# Patient Record
Sex: Male | Born: 1955 | Race: White | Hispanic: Yes | Marital: Married | Smoking: Current every day smoker
Health system: Southern US, Community
[De-identification: ages and names within clinical notes are randomized; demographics above are authoritative.]

## PROBLEM LIST (undated history)

## (undated) DIAGNOSIS — I10 Essential (primary) hypertension: Secondary | ICD-10-CM

## (undated) DIAGNOSIS — Z720 Tobacco use: Secondary | ICD-10-CM

## (undated) DIAGNOSIS — E785 Hyperlipidemia, unspecified: Secondary | ICD-10-CM

## (undated) HISTORY — PX: EYE SURGERY: SHX253

## (undated) HISTORY — PX: COLONOSCOPY: SHX174

---

## 2018-08-10 ENCOUNTER — Other Ambulatory Visit: Payer: Self-pay | Admitting: Internal Medicine

## 2018-08-10 DIAGNOSIS — Z20822 Contact with and (suspected) exposure to covid-19: Secondary | ICD-10-CM

## 2018-08-14 LAB — NOVEL CORONAVIRUS, NAA: SARS-CoV-2, NAA: NOT DETECTED

## 2018-08-18 ENCOUNTER — Telehealth: Payer: Self-pay

## 2018-08-18 NOTE — Telephone Encounter (Signed)
Pt's wife was notified of result. Nothing further is needed

## 2019-04-15 ENCOUNTER — Ambulatory Visit: Payer: Self-pay | Attending: Internal Medicine

## 2019-04-15 DIAGNOSIS — Z23 Encounter for immunization: Secondary | ICD-10-CM

## 2019-04-15 NOTE — Progress Notes (Signed)
   Covid-19 Vaccination Clinic  Name:  William Osborne    MRN: [id number] DOB: 1955-10-24  04/15/2019  [name] was observed post Covid-19 immunization for 15 minutes without incident. He was provided with Vaccine Information Sheet and instruction to access the V-Safe system.   [name] was instructed to call 911 with any severe reactions post vaccine: Marland Kitchen Difficulty breathing  . Swelling of face and throat  . A fast heartbeat  . A bad rash all over body  . Dizziness and weakness   Immunizations Administered    Name Date Dose VIS Date Route   Pfizer COVID-19 Vaccine 04/15/2019  2:29 PM 0.3 mL 12/29/2018 Intramuscular   Manufacturer: [employer], [employer]   Lot: [id number]   NDC: [address]-5

## 2019-05-06 ENCOUNTER — Ambulatory Visit: Payer: Self-pay | Attending: Internal Medicine

## 2019-05-06 DIAGNOSIS — Z23 Encounter for immunization: Secondary | ICD-10-CM

## 2019-05-06 NOTE — Progress Notes (Signed)
   Covid-19 Vaccination Clinic  Name:  Labarron Durnin    MRN: [id number] DOB: December 21, 1955  05/06/2019  Mr. Heyer was observed post Covid-19 immunization for 15 minutes without incident. He was provided with Vaccine Information Sheet and instruction to access the V-Safe system.   Mr. Barton was instructed to call 911 with any severe reactions post vaccine: Marland Kitchen Difficulty breathing  . Swelling of face and throat  . A fast heartbeat  . A bad rash all over body  . Dizziness and weakness   Immunizations Administered    Name Date Dose VIS Date Route   Pfizer COVID-19 Vaccine 05/06/2019  1:22 PM 0.3 mL 12/29/2018 Intramuscular   Manufacturer: [employer], [employer]   Lot: [id number]   NDC: [address]-3

## 2019-09-09 ENCOUNTER — Emergency Department: Payer: Commercial Managed Care - PPO

## 2019-09-09 ENCOUNTER — Emergency Department
Admission: EM | Admit: 2019-09-09 | Discharge: 2019-09-09 | Disposition: A | Discharge status: Home / Self Care | Payer: Commercial Managed Care - PPO | Attending: Emergency Medicine | Admitting: Emergency Medicine

## 2019-09-09 ENCOUNTER — Encounter: Payer: Self-pay | Admitting: Emergency Medicine

## 2019-09-09 ENCOUNTER — Other Ambulatory Visit: Payer: Self-pay

## 2019-09-09 DIAGNOSIS — K4090 Unilateral inguinal hernia, without obstruction or gangrene, not specified as recurrent: Secondary | ICD-10-CM | POA: Insufficient documentation

## 2019-09-09 LAB — CBC WITH DIFFERENTIAL/PLATELET
Abs Immature Granulocytes: 0.03 10*3/uL (ref 0.00–0.07)
Basophils Absolute: 0.1 10*3/uL (ref 0.0–0.1)
Basophils Relative: 1 %
Eosinophils Absolute: 0.3 10*3/uL (ref 0.0–0.5)
Eosinophils Relative: 3 %
HCT: 42.5 % (ref 39.0–52.0)
Hemoglobin: 14.3 g/dL (ref 13.0–17.0)
Immature Granulocytes: 0 %
Lymphocytes Relative: 21 %
Lymphs Abs: 1.6 10*3/uL (ref 0.7–4.0)
MCH: 29 pg (ref 26.0–34.0)
MCHC: 33.6 g/dL (ref 30.0–36.0)
MCV: 86.2 fL (ref 80.0–100.0)
Monocytes Absolute: 0.9 10*3/uL (ref 0.1–1.0)
Monocytes Relative: 12 %
Neutro Abs: 4.9 10*3/uL (ref 1.7–7.7)
Neutrophils Relative %: 63 %
Platelets: 352 10*3/uL (ref 150–400)
RBC: 4.93 MIL/uL (ref 4.22–5.81)
RDW: 13.8 % (ref 11.5–15.5)
WBC: 7.8 10*3/uL (ref 4.0–10.5)
nRBC: 0 % (ref 0.0–0.2)

## 2019-09-09 LAB — COMPREHENSIVE METABOLIC PANEL
ALT: 20 U/L (ref 0–44)
AST: 26 U/L (ref 15–41)
Albumin: 4.3 g/dL (ref 3.5–5.0)
Alkaline Phosphatase: 74 U/L (ref 38–126)
Anion gap: 12 (ref 5–15)
BUN: 14 mg/dL (ref 8–23)
CO2: 25 mmol/L (ref 22–32)
Calcium: 9.8 mg/dL (ref 8.9–10.3)
Chloride: 97 mmol/L — ABNORMAL LOW (ref 98–111)
Creatinine, Ser: 0.95 mg/dL (ref 0.61–1.24)
GFR calc Af Amer: >60 mL/min (ref >60)
GFR calc non Af Amer: >60 mL/min (ref >60)
Glucose, Bld: 125 mg/dL — ABNORMAL HIGH (ref 70–99)
Potassium: 4.3 mmol/L (ref 3.5–5.1)
Sodium: 134 mmol/L — ABNORMAL LOW (ref 135–145)
Total Bilirubin: 0.8 mg/dL (ref 0.3–1.2)
Total Protein: 8.9 g/dL — ABNORMAL HIGH (ref 6.5–8.1)

## 2019-09-09 LAB — LACTIC ACID, PLASMA: Lactic Acid, Venous: 0.9 mmol/L (ref 0.5–1.9)

## 2019-09-09 MED ORDER — IOHEXOL 9 MG/ML PO SOLN
500.0000 mL | Freq: Two times a day (BID) | ORAL | Status: DC | PRN
  Administered 2019-09-09: 500 mL via ORAL

## 2019-09-09 MED ORDER — IOHEXOL 300 MG/ML  SOLN
100.0000 mL | Freq: Once | INTRAMUSCULAR | Status: AC | PRN
  Administered 2019-09-09: 100 mL via INTRAVENOUS

## 2019-09-09 NOTE — ED Provider Notes (Signed)
[facility] Emergency Department Provider Note   ____________________________________________    I have reviewed the triage vital signs and the nursing notes.   HISTORY  Chief Complaint Inguinal Hernia     HPI William Osborne is a 64 y.o. male who presents with complaints of right groin discomfort.  Patient reports for approximately 1 month he has had intermittent discomfort in his right groin, occasionally has gotten worse currently it is feeling improved.  He does do physical labor.  No fevers or chills.  No nausea or vomiting.  No dysuria.  Has not take anything for this.  He did go to urgent care, apparently was referred to the emergency department for unclear reasons.  History reviewed. No pertinent past medical history.  There are no problems to display for this patient.   History reviewed. No pertinent surgical history.  Prior to Admission medications   Not on File     Allergies Patient has no known allergies.  No family history on file.  Social History Social History   Tobacco Use  . Smoking status: Never Smoker  . Smokeless tobacco: Never Used  Substance Use Topics  . Alcohol use: Yes  . Drug use: Never    Review of Systems  Constitutional: No fever/chills Eyes: No visual changes.  ENT: No sore throat. Cardiovascular: Denies chest pain. Respiratory: Denies shortness of breath. Gastrointestinal: As above  Genitourinary: Negative for dysuria.  No hematuria Musculoskeletal: Negative for back pain. Skin: Negative for rash. Neurological: Negative for headaches    ____________________________________________   PHYSICAL EXAM:  VITAL SIGNS: ED Triage Vitals  Enc Vitals Group     BP 09/09/19 0632 (!) 161/59     Pulse Rate 09/09/19 0632 77     Resp 09/09/19 0632 18     Temp 09/09/19 0632 99.1 F (37.3 C)     Temp Source 09/09/19 0632 Oral     SpO2 09/09/19 0632 97 %     Weight 09/09/19 0633 63.5 kg (140  lb)     Height 09/09/19 0633 1.549 m (5\' 1" )     Head Circumference --      Peak Flow --      Pain Score 09/09/19 0632 5     Pain Loc --      Pain Edu? --      Excl. in GC? --     Constitutional: Alert and oriented. No acute distress. Pleasant and interactive  Nose: No congestion/rhinnorhea. Mouth/Throat: Mucous membranes are moist.    Cardiovascular: Normal rate, regular rhythm. Grossly normal heart sounds.  Good peripheral circulation. Respiratory: Normal respiratory effort.  No retractions. Lungs CTAB. Gastrointestinal: Soft and nontender. No distention.  No CVA tenderness.  Genitourinary: Right inguinal hernia palpated, nontender, reducible Musculoskeletal: No lower extremity tenderness nor edema.  Warm and well perfused Neurologic:  Normal speech and language. No gross focal neurologic deficits are appreciated.  Skin:  Skin is warm, dry and intact. No rash noted. Psychiatric: Mood and affect are normal. Speech and behavior are normal.  ____________________________________________   LABS (all labs ordered are listed, but only abnormal results are displayed)  Labs Reviewed  COMPREHENSIVE METABOLIC PANEL - Abnormal; Notable for the following components:      Result Value   Sodium 134 (*)    Chloride 97 (*)    Glucose, Bld 125 (*)    Total Protein 8.9 (*)    All other components within normal limits  CBC WITH DIFFERENTIAL/PLATELET  LACTIC ACID,  PLASMA  LACTIC ACID, PLASMA   ____________________________________________  EKG  None ____________________________________________  RADIOLOGY  CT abdomen pelvis without acute abnormality ____________________________________________   PROCEDURES  Procedure(s) performed: No  Procedures   Critical Care performed: No ____________________________________________   INITIAL IMPRESSION / ASSESSMENT AND PLAN / ED COURSE  Pertinent labs & imaging results that were available during my care of the patient were reviewed  by me and considered in my medical decision making (see chart for details).  Patient presents with right inguinal discomfort suspicious for inguinal hernia.  Small inguinal hernia noted on exam, reducible, nontender.  Lab work is quite reassuring with normal white blood cell count.  CT scan had been obtained in triage due to long wait times which does not demonstrate any acute abnormalities  Refer to general surgery for further evaluation and treatment    ____________________________________________   FINAL CLINICAL IMPRESSION(S) / ED DIAGNOSES  Final diagnoses:  Inguinal hernia of right side without obstruction or gangrene        Note:  This document was prepared using Dragon voice recognition software and may include unintentional dictation errors.   Jene Every, MD 09/09/19 [phone]

## 2019-09-09 NOTE — ED Triage Notes (Signed)
Patient states that he has had a right inguinal hernia times one month. Patient states that he had an increase in swelling and went to [facility]. Patient states that [facility] called him and told him to come to the ED.

## 2019-09-09 NOTE — ED Notes (Signed)
Pt wandered over to B side and let this RN know that he was finished with oral contrast. This RN informed CT.

## 2019-09-13 ENCOUNTER — Ambulatory Visit: Payer: PRIVATE HEALTH INSURANCE | Admitting: Surgery

## 2019-09-20 ENCOUNTER — Ambulatory Visit: Payer: PRIVATE HEALTH INSURANCE | Admitting: Surgery

## 2019-11-01 ENCOUNTER — Encounter: Payer: Self-pay | Admitting: *Deleted

## 2019-11-13 ENCOUNTER — Other Ambulatory Visit: Payer: Self-pay

## 2019-11-13 ENCOUNTER — Ambulatory Visit (INDEPENDENT_AMBULATORY_CARE_PROVIDER_SITE_OTHER): Payer: PRIVATE HEALTH INSURANCE | Admitting: Surgery

## 2019-11-13 ENCOUNTER — Encounter: Payer: Self-pay | Admitting: Surgery

## 2019-11-13 ENCOUNTER — Telehealth: Payer: Self-pay | Admitting: Surgery

## 2019-11-13 ENCOUNTER — Ambulatory Visit: Payer: Self-pay | Admitting: Surgery

## 2019-11-13 VITALS — BP 171/85 | HR 78 | Temp 99.0°F | Resp 12 | Ht 61.0 in | Wt 136.4 lb

## 2019-11-13 DIAGNOSIS — K4090 Unilateral inguinal hernia, without obstruction or gangrene, not specified as recurrent: Secondary | ICD-10-CM

## 2019-11-13 NOTE — Patient Instructions (Signed)
Our surgery scheduler will contact you to schedule your surgery. Have the blue sheet available when she calls you.  Call the office if you have any questions or concerns.   Inguinal Hernia, Adult An inguinal hernia develops when fat or the intestines push through a weak spot in a muscle where your leg meets your lower abdomen (groin). This creates a bulge. This kind of hernia could also be:  In your scrotum, if you are male.  In folds of skin around your vagina, if you are male. There are three types of inguinal hernias:  Hernias that can be pushed back into the abdomen (are reducible). This type rarely causes pain.  Hernias that are not reducible (are incarcerated).  Hernias that are not reducible and lose their blood supply (are strangulated). This type of hernia requires emergency surgery. What are the causes? This condition is caused by having a weak spot in the muscles or tissues in the groin. This weak spot develops over time. The hernia may poke through the weak spot when you suddenly strain your lower abdominal muscles, such as when you:  Lift a heavy object.  Strain to have a bowel movement. Constipation can lead to straining.  Cough. What increases the risk? This condition is more likely to develop in:  Men.  Pregnant women.  People who: ? Are overweight. ? Work in jobs that require long periods of standing or heavy lifting. ? Have had an inguinal hernia before. ? Smoke or have lung disease. These factors can lead to long-lasting (chronic) coughing. What are the signs or symptoms? Symptoms may depend on the size of the hernia. Often, a small inguinal hernia has no symptoms. Symptoms of a larger hernia may include:  A lump in the groin area. This is easier to see when standing. It might not be visible when lying down.  Pain or burning in the groin. This may get worse when lifting, straining, or coughing.  A dull ache or a feeling of pressure in the groin.  In  men, an unusual lump in the scrotum. Symptoms of a strangulated inguinal hernia may include:  A bulge in your groin that is very painful and tender to the touch.  A bulge that turns red or purple.  Fever, nausea, and vomiting.  Inability to have a bowel movement or to pass gas. How is this diagnosed? This condition is diagnosed based on your symptoms, your medical history, and a physical exam. Your health care provider may feel your groin area and ask you to cough. How is this treated? Treatment depends on the size of your hernia and whether you have symptoms. If you do not have symptoms, your health care provider may have you watch your hernia carefully and have you come in for follow-up visits. If your hernia is large or if you have symptoms, you may need surgery to repair the hernia. Follow these instructions at home: Lifestyle  Avoid lifting heavy objects.  Avoid standing for long periods of time.  Do not use any products that contain nicotine or tobacco, such as cigarettes and e-cigarettes. If you need help quitting, ask your health care provider.  Maintain a healthy weight. Preventing constipation  Take actions to prevent constipation. Constipation leads to straining with bowel movements, which can make a hernia worse or cause a hernia repair to break down. Your health care provider may recommend that you: ? Drink enough fluid to keep your urine pale yellow. ? Eat foods that are high in  fiber, such as fresh fruits and vegetables, whole grains, and beans. ? Limit foods that are high in fat and processed sugars, such as fried or sweet foods. ? Take an over-the-counter or prescription medicine for constipation. General instructions  You may try to push the hernia back in place by very gently pressing on it while lying down. Do not try to force the bulge back in if it will not push in easily.  Watch your hernia for any changes in shape, size, or color. Get help right away if you  notice any changes.  Take over-the-counter and prescription medicines only as told by your health care provider.  Keep all follow-up visits as told by your health care provider. This is important. Contact a health care provider if:  You have a fever.  You develop new symptoms.  Your symptoms get worse. Get help right away if:  You have pain in your groin that suddenly gets worse.  You have a bulge in your groin that: ? Suddenly gets bigger and does not get smaller. ? Becomes red or purple or painful to the touch.  You are a man and you have a sudden pain in your scrotum, or the size of your scrotum suddenly changes.  You cannot push the hernia back in place by very gently pressing on it when you are lying down. Do not try to force the bulge back in if it will not push in easily.  You have nausea or vomiting that does not go away.  You have a fast heartbeat.  You cannot have a bowel movement or pass gas. These symptoms may represent a serious problem that is an emergency. Do not wait to see if the symptoms will go away. Get medical help right away. Call your local emergency services (911 in the [place] Summary  An inguinal hernia develops when fat or the intestines push through a weak spot in a muscle where your leg meets your lower abdomen (groin).  This condition is caused by having a weak spot in muscles or tissue in your groin.  Symptoms may depend on the size of the hernia, and they may include pain or swelling in your groin. A small inguinal hernia often has no symptoms.  Treatment may not be needed if you do not have symptoms. If you have symptoms or a large hernia, you may need surgery to repair the hernia.  Avoid lifting heavy objects. Also avoid standing for long amounts of time. This information is not intended to replace advice given to you by your health care provider. Make sure you discuss any questions you have with your health care provider. Document Revised:  02/05/2017 Document Reviewed: 10/06/2016 Elsevier Patient Education  2020 [employer].

## 2019-11-13 NOTE — H&P (View-Only) (Signed)
Patient ID: William Osborne, male   DOB: 22-Jun-1955, 64 y.o.   MRN: [id number]  Chief Complaint: Right inguinal hernia  History of Present Illness William Osborne is a 64 y.o. male with right groin pain and bulge noted back in August.  When it began it was painful to walk, even uncomfortable when laying flat.  The swelling and discomfort is diminished somewhat.  The bulge persists but does not stay out.  No prior history of hernia repair, no difficulty urinating.  Denies any history of fevers or chills.  Past Medical History History reviewed. No pertinent past medical history.    Past Surgical History:  Procedure Laterality Date  . EYE SURGERY      No Known Allergies  No current outpatient medications on file.   No current facility-administered medications for this visit.    Family History History reviewed. No pertinent family history.    Social History Social History   Tobacco Use  . Smoking status: Former Smoker    Types: Cigarettes  . Smokeless tobacco: Never Used  Substance Use Topics  . Alcohol use: Yes  . Drug use: Never        Review of Systems  Constitutional: Negative.   HENT: Positive for hearing loss and tinnitus.   Eyes: Negative.   Respiratory: Negative.   Cardiovascular: Negative.   Gastrointestinal: Negative.   Genitourinary: Negative.   Skin: Positive for itching.  Neurological: Negative.   Psychiatric/Behavioral: Negative.       Physical Exam Blood pressure (!) 171/85, pulse 78, temperature 99 F (37.2 C), resp. rate 12, height 5\' 1"  (1.549 m), weight 136 lb 6.4 oz (61.9 kg), SpO2 96 %. Last Weight  Most recent update: 11/13/2019  2:39 PM   Weight  61.9 kg (136 lb 6.4 oz)            CONSTITUTIONAL: Well developed, and nourished, appropriately responsive and aware without distress.   EYES: Sclera non-icteric.   EARS, NOSE, MOUTH AND THROAT: Mask worn.   Hearing is intact to voice.  NECK: Trachea is midline, and there  is no jugular venous distension.  LYMPH NODES:  Lymph nodes in the neck are not enlarged. RESPIRATORY:  Lungs are clear, and breath sounds are equal bilaterally. Normal respiratory effort without pathologic use of accessory muscles. CARDIOVASCULAR: Heart is regular in rate and rhythm. GI: The abdomen is soft, nontender, and nondistended. There were no palpable masses. I did not appreciate hepatosplenomegaly. There were normal bowel sounds. GU: Right groin bulge, consistent with well-established hernia.  Testes are descended bilaterally.  Possible hernia on the left side as well. MUSCULOSKELETAL:  Symmetrical muscle tone appreciated in all four extremities.    SKIN: Skin turgor is normal. No pathologic skin lesions appreciated.  NEUROLOGIC:  Motor and sensation appear grossly normal.  Cranial nerves are grossly without defect. PSYCH:  Alert and oriented to person, place and time. Affect is appropriate for situation.  Data Reviewed I have personally reviewed what is currently available of the patient's imaging, recent labs and medical records.   Labs:  CBC Latest Ref Rng & Units 09/09/2019  WBC 4.0 - 10.5 K/uL 7.8  Hemoglobin 13.0 - 17.0 g/dL 09/11/2019  Hematocrit 39 - 52 % 42.5  Platelets 150 - 400 K/uL 352   CMP Latest Ref Rng & Units 09/09/2019  Glucose 70 - 99 mg/dL 09/11/2019)  BUN 8 - 23 mg/dL 14  Creatinine 338(S - 5.05 mg/dL 3.97  Sodium 6.73 - 419 mmol/L 134(L)  Potassium 3.5 - 5.1 mmol/L 4.3  Chloride 98 - 111 mmol/L 97(L)  CO2 22 - 32 mmol/L 25  Calcium 8.9 - 10.3 mg/dL 9.8  Total Protein 6.5 - 8.1 g/dL 8.9(H)  Total Bilirubin 0.3 - 1.2 mg/dL 0.8  Alkaline Phos 38 - 126 U/L 74  AST 15 - 41 U/L 26  ALT 0 - 44 U/L 20      Imaging:  Within last 24 hrs: No results found.  Assessment    Right inguinal hernia, possible bilateral. There are no problems to display for this patient.   Plan    Robotic right inguinal hernia repair, possible bilateral.  I discussed possibility of  incarceration, strangulation, enlargement in size over time, and the risk of emergency surgery in the face of strangulation.  Also discussed the risk of surgery including recurrence, use of prosthetic materials (mesh) and the increased risk of infxn, post-op infxn and the possible need for re-operation and removal of mesh if used, possibility of post-op SBO or ileus, and the risks of general anesthetic including MI, CVA, sudden death or even reaction to anesthetic medications. The patient and family understands the risks, any and all questions were answered to the patient's satisfaction.   Face-to-face time spent with the patient and accompanying care providers(if present) was 30 minutes, with more than 50% of the time spent counseling, educating, and coordinating care of the patient.      Campbell Lerner M.D., FACS 11/13/2019, 3:04 PM

## 2019-11-13 NOTE — Progress Notes (Signed)
Patient ID: William Osborne, male   DOB: 22-Jun-1955, 64 y.o.   MRN: [id number]  Chief Complaint: Right inguinal hernia  History of Present Illness William Osborne is a 64 y.o. male with right groin pain and bulge noted back in August.  When it began it was painful to walk, even uncomfortable when laying flat.  The swelling and discomfort is diminished somewhat.  The bulge persists but does not stay out.  No prior history of hernia repair, no difficulty urinating.  Denies any history of fevers or chills.  Past Medical History History reviewed. No pertinent past medical history.    Past Surgical History:  Procedure Laterality Date  . EYE SURGERY      No Known Allergies  No current outpatient medications on file.   No current facility-administered medications for this visit.    Family History History reviewed. No pertinent family history.    Social History Social History   Tobacco Use  . Smoking status: Former Smoker    Types: Cigarettes  . Smokeless tobacco: Never Used  Substance Use Topics  . Alcohol use: Yes  . Drug use: Never        Review of Systems  Constitutional: Negative.   HENT: Positive for hearing loss and tinnitus.   Eyes: Negative.   Respiratory: Negative.   Cardiovascular: Negative.   Gastrointestinal: Negative.   Genitourinary: Negative.   Skin: Positive for itching.  Neurological: Negative.   Psychiatric/Behavioral: Negative.       Physical Exam Blood pressure (!) 171/85, pulse 78, temperature 99 F (37.2 C), resp. rate 12, height 5\' 1"  (1.549 m), weight 136 lb 6.4 oz (61.9 kg), SpO2 96 %. Last Weight  Most recent update: 11/13/2019  2:39 PM   Weight  61.9 kg (136 lb 6.4 oz)            CONSTITUTIONAL: Well developed, and nourished, appropriately responsive and aware without distress.   EYES: Sclera non-icteric.   EARS, NOSE, MOUTH AND THROAT: Mask worn.   Hearing is intact to voice.  NECK: Trachea is midline, and there  is no jugular venous distension.  LYMPH NODES:  Lymph nodes in the neck are not enlarged. RESPIRATORY:  Lungs are clear, and breath sounds are equal bilaterally. Normal respiratory effort without pathologic use of accessory muscles. CARDIOVASCULAR: Heart is regular in rate and rhythm. GI: The abdomen is soft, nontender, and nondistended. There were no palpable masses. I did not appreciate hepatosplenomegaly. There were normal bowel sounds. GU: Right groin bulge, consistent with well-established hernia.  Testes are descended bilaterally.  Possible hernia on the left side as well. MUSCULOSKELETAL:  Symmetrical muscle tone appreciated in all four extremities.    SKIN: Skin turgor is normal. No pathologic skin lesions appreciated.  NEUROLOGIC:  Motor and sensation appear grossly normal.  Cranial nerves are grossly without defect. PSYCH:  Alert and oriented to person, place and time. Affect is appropriate for situation.  Data Reviewed I have personally reviewed what is currently available of the patient's imaging, recent labs and medical records.   Labs:  CBC Latest Ref Rng & Units 09/09/2019  WBC 4.0 - 10.5 K/uL 7.8  Hemoglobin 13.0 - 17.0 g/dL 09/11/2019  Hematocrit 39 - 52 % 42.5  Platelets 150 - 400 K/uL 352   CMP Latest Ref Rng & Units 09/09/2019  Glucose 70 - 99 mg/dL 09/11/2019)  BUN 8 - 23 mg/dL 14  Creatinine 338(S - 5.05 mg/dL 3.97  Sodium 6.73 - 419 mmol/L 134(L)  Potassium 3.5 - 5.1 mmol/L 4.3  Chloride 98 - 111 mmol/L 97(L)  CO2 22 - 32 mmol/L 25  Calcium 8.9 - 10.3 mg/dL 9.8  Total Protein 6.5 - 8.1 g/dL 8.9(H)  Total Bilirubin 0.3 - 1.2 mg/dL 0.8  Alkaline Phos 38 - 126 U/L 74  AST 15 - 41 U/L 26  ALT 0 - 44 U/L 20      Imaging:  Within last 24 hrs: No results found.  Assessment    Right inguinal hernia, possible bilateral. There are no problems to display for this patient.   Plan    Robotic right inguinal hernia repair, possible bilateral.  I discussed possibility of  incarceration, strangulation, enlargement in size over time, and the risk of emergency surgery in the face of strangulation.  Also discussed the risk of surgery including recurrence, use of prosthetic materials (mesh) and the increased risk of infxn, post-op infxn and the possible need for re-operation and removal of mesh if used, possibility of post-op SBO or ileus, and the risks of general anesthetic including MI, CVA, sudden death or even reaction to anesthetic medications. The patient and family understands the risks, any and all questions were answered to the patient's satisfaction.   Face-to-face time spent with the patient and accompanying care providers(if present) was 30 minutes, with more than 50% of the time spent counseling, educating, and coordinating care of the patient.      Dhanya Bogle M.D., FACS 11/13/2019, 3:04 PM     

## 2019-11-13 NOTE — Telephone Encounter (Signed)
Spoke with wife, Carollee Herter, she is informed of Pre-Admission date/time, COVID Testing date and Surgery date.  Surgery Date: 11/26/19 Preadmission Testing Date: 11/19/19 (phone 8a-1p) Covid Testing Date: 11/22/19 - patient advised to [facility] to [facility] ([address] [facility]) between 8a-1p   Patient has been made aware to call [phone], between 1-3:00pm the day before surgery, to find out what time to arrive for surgery.

## 2019-11-19 ENCOUNTER — Other Ambulatory Visit: Payer: Self-pay

## 2019-11-19 ENCOUNTER — Encounter
Admission: RE | Admit: 2019-11-19 | Discharge: 2019-11-19 | Disposition: A | Discharge status: Home / Self Care | Payer: PRIVATE HEALTH INSURANCE | Source: Ambulatory Visit | Attending: Surgery | Admitting: Surgery

## 2019-11-19 DIAGNOSIS — Z01818 Encounter for other preprocedural examination: Secondary | ICD-10-CM | POA: Insufficient documentation

## 2019-11-19 NOTE — Patient Instructions (Signed)
Your procedure is scheduled on:11-26-19 MONDAY Report to Day Surgery on the 2nd floor of the [facility]. To find out your arrival time, please call [phone] between 1PM - 3PM on:11-23-19 FRIDAY  REMEMBER: Instructions that are not followed completely may result in serious medical risk, up to and including death; or upon the discretion of your surgeon and anesthesiologist your surgery may need to be rescheduled.  Do not eat food after midnight the night before surgery.  No gum chewing, lozengers or hard candies.  You may however, drink CLEAR liquids up to 2 hours before you are scheduled to arrive for your surgery. Do not drink anything within 2 hours of your scheduled arrival time.  Clear liquids include: - water  - apple juice without pulp - gatorade (not RED, PURPLE, OR BLUE) - black coffee or tea (Do NOT add milk or creamers to the coffee or tea) Do NOT drink anything that is not on this list.  TAKE THESE MEDICATIONS THE MORNING OF SURGERY WITH A SIP OF WATER: -NONE  One week prior to surgery: Stop Anti-inflammatories (NSAIDS) such as Advil, Aleve, Ibuprofen, Motrin, Naproxen, Naprosyn and Aspirin based products such as Excedrin, Goodys Powder, BC Powder-OK TO TAKE TYLENOL IF NEEDED  Stop ANY OVER THE COUNTER supplements until after surgery.   No Alcohol for 24 hours before or after surgery.  No Smoking including e-cigarettes for 24 hours prior to surgery.  No chewable tobacco products for at least 6 hours prior to surgery.  No nicotine patches on the day of surgery.  Do not use any "recreational" drugs for at least a week prior to your surgery.  Please be advised that the combination of cocaine and anesthesia may have negative outcomes, up to and including death. If you test positive for cocaine, your surgery will be cancelled.  On the morning of surgery brush your teeth with toothpaste and water, you may rinse your mouth with mouthwash if you wish. Do not swallow any  toothpaste or mouthwash.  Do not wear jewelry, make-up, hairpins, clips or nail polish.  Do not wear lotions, powders, or perfumes.   Do not shave 48 hours prior to surgery.   Contact lenses, hearing aids and dentures may not be worn into surgery.  Do not bring valuables to the hospital. [facility] is not responsible for any missing/lost belongings or valuables.   Use CHG Soap as directed on instruction sheet.  Notify your doctor if there is any change in your medical condition (cold, fever, infection).  Wear comfortable clothing (specific to your surgery type) to the hospital.  Plan for stool softeners for home use; pain medications have a tendency to cause constipation. You can also help prevent constipation by eating foods high in fiber such as fruits and vegetables and drinking plenty of fluids as your diet allows.  After surgery, you can help prevent lung complications by doing breathing exercises.  Take deep breaths and cough every 1-2 hours. Your doctor may order a device called an Incentive Spirometer to help you take deep breaths. When coughing or sneezing, hold a pillow firmly against your incision with both hands. This is called "splinting." Doing this helps protect your incision. It also decreases belly discomfort.  If you are being admitted to the hospital overnight, leave your suitcase in the car. After surgery it may be brought to your room.  If you are being discharged the day of surgery, you will not be allowed to drive home. You will need a  responsible adult (18 years or older) to drive you home and stay with you that night.   If you are taking public transportation, you will need to have a responsible adult (18 years or older) with you. Please confirm with your physician that it is acceptable to use public transportation.   Please call the [facility] at [phone] if you have any questions about these instructions.  Visitation  Policy:  Patients undergoing a surgery or procedure may have one family member or support person with them as long as that person is not COVID-19 positive or experiencing its symptoms.  That person may remain in the waiting area during the procedure.  Inpatient Visitation Update:   In an effort to ensure the safety of our team members and our patients, we are implementing a change to our visitation policy:  Effective Monday, Aug. 9, at 7 a.m., inpatients will be allowed one support person.  o The support person may change daily.  o The support person must pass our screening, gel in and out, and wear a mask at all times, including in the patient's room.  o Patients must also wear a mask when staff or their support person are in the room.  o Masking is required regardless of vaccination status.  Systemwide, no visitors 17 or younger.

## 2019-11-22 ENCOUNTER — Other Ambulatory Visit
Admission: RE | Admit: 2019-11-22 | Discharge: 2019-11-22 | Disposition: A | Discharge status: Home / Self Care | Payer: Commercial Managed Care - PPO | Source: Ambulatory Visit | Attending: Surgery | Admitting: Surgery

## 2019-11-22 ENCOUNTER — Other Ambulatory Visit: Payer: Self-pay

## 2019-11-22 DIAGNOSIS — Z01812 Encounter for preprocedural laboratory examination: Secondary | ICD-10-CM | POA: Diagnosis not present

## 2019-11-22 DIAGNOSIS — Z20822 Contact with and (suspected) exposure to covid-19: Secondary | ICD-10-CM | POA: Diagnosis not present

## 2019-11-22 LAB — CBC WITH DIFFERENTIAL/PLATELET
Abs Immature Granulocytes: 0.02 10*3/uL (ref 0.00–0.07)
Basophils Absolute: 0.1 10*3/uL (ref 0.0–0.1)
Basophils Relative: 2 %
Eosinophils Absolute: 0.4 10*3/uL (ref 0.0–0.5)
Eosinophils Relative: 6 %
HCT: 40.2 % (ref 39.0–52.0)
Hemoglobin: 13.9 g/dL (ref 13.0–17.0)
Immature Granulocytes: 0 %
Lymphocytes Relative: 31 %
Lymphs Abs: 1.8 10*3/uL (ref 0.7–4.0)
MCH: 29 pg (ref 26.0–34.0)
MCHC: 34.6 g/dL (ref 30.0–36.0)
MCV: 83.9 fL (ref 80.0–100.0)
Monocytes Absolute: 0.7 10*3/uL (ref 0.1–1.0)
Monocytes Relative: 12 %
Neutro Abs: 2.9 10*3/uL (ref 1.7–7.7)
Neutrophils Relative %: 49 %
Platelets: 292 10*3/uL (ref 150–400)
RBC: 4.79 MIL/uL (ref 4.22–5.81)
RDW: 14.7 % (ref 11.5–15.5)
WBC: 5.9 10*3/uL (ref 4.0–10.5)
nRBC: 0 % (ref 0.0–0.2)

## 2019-11-22 LAB — COMPREHENSIVE METABOLIC PANEL
ALT: 25 U/L (ref 0–44)
AST: 27 U/L (ref 15–41)
Albumin: 4.3 g/dL (ref 3.5–5.0)
Alkaline Phosphatase: 53 U/L (ref 38–126)
Anion gap: 10 (ref 5–15)
BUN: 15 mg/dL (ref 8–23)
CO2: 25 mmol/L (ref 22–32)
Calcium: 9.9 mg/dL (ref 8.9–10.3)
Chloride: 99 mmol/L (ref 98–111)
Creatinine, Ser: 0.91 mg/dL (ref 0.61–1.24)
GFR, Estimated: >60 mL/min (ref >60)
Glucose, Bld: 100 mg/dL — ABNORMAL HIGH (ref 70–99)
Potassium: 4.1 mmol/L (ref 3.5–5.1)
Sodium: 134 mmol/L — ABNORMAL LOW (ref 135–145)
Total Bilirubin: 0.9 mg/dL (ref 0.3–1.2)
Total Protein: 8 g/dL (ref 6.5–8.1)

## 2019-11-22 LAB — SARS CORONAVIRUS 2 (TAT 6-24 HRS): SARS Coronavirus 2: NEGATIVE

## 2019-11-25 MED ORDER — LACTATED RINGERS IV SOLN: INTRAVENOUS | Status: DC

## 2019-11-25 MED ORDER — ACETAMINOPHEN 500 MG PO TABS: 1000.0000 mg | ORAL_TABLET | ORAL | Status: AC

## 2019-11-25 MED ORDER — BUPIVACAINE LIPOSOME 1.3 % IJ SUSP: 20.0000 mL | Freq: Once | INTRAMUSCULAR | Status: DC

## 2019-11-25 MED ORDER — ORAL CARE MOUTH RINSE: 15.0000 mL | Freq: Once | OROMUCOSAL | Status: AC

## 2019-11-25 MED ORDER — CELECOXIB 200 MG PO CAPS: 200.0000 mg | ORAL_CAPSULE | ORAL | Status: AC

## 2019-11-25 MED ORDER — CEFAZOLIN SODIUM-DEXTROSE 2-4 GM/100ML-% IV SOLN
2.0000 g | INTRAVENOUS | Status: AC
  Administered 2019-11-26: 2 g via INTRAVENOUS

## 2019-11-25 MED ORDER — GABAPENTIN 300 MG PO CAPS: 300.0000 mg | ORAL_CAPSULE | ORAL | Status: AC

## 2019-11-25 MED ORDER — FAMOTIDINE 20 MG PO TABS: 20.0000 mg | ORAL_TABLET | Freq: Once | ORAL | Status: AC

## 2019-11-25 MED ORDER — CHLORHEXIDINE GLUCONATE 0.12 % MT SOLN
15.0000 mL | Freq: Once | OROMUCOSAL | Status: AC
Start: 2019-11-25 — End: 2019-11-26

## 2019-11-26 ENCOUNTER — Ambulatory Visit
Admission: RE | Admit: 2019-11-26 | Discharge: 2019-11-26 | Disposition: A | Discharge status: Home / Self Care | Payer: Commercial Managed Care - PPO | Attending: Surgery | Admitting: Surgery

## 2019-11-26 ENCOUNTER — Ambulatory Visit: Payer: Commercial Managed Care - PPO | Admitting: Anesthesiology

## 2019-11-26 ENCOUNTER — Encounter: Payer: Self-pay | Admitting: Surgery

## 2019-11-26 ENCOUNTER — Other Ambulatory Visit: Payer: Self-pay

## 2019-11-26 ENCOUNTER — Encounter
Admission: RE | Disposition: A | Discharge status: Home / Self Care | Payer: Self-pay | Source: Home / Self Care | Attending: Surgery

## 2019-11-26 DIAGNOSIS — K4090 Unilateral inguinal hernia, without obstruction or gangrene, not specified as recurrent: Secondary | ICD-10-CM | POA: Diagnosis not present

## 2019-11-26 DIAGNOSIS — Z87891 Personal history of nicotine dependence: Secondary | ICD-10-CM | POA: Insufficient documentation

## 2019-11-26 HISTORY — PX: XI ROBOTIC ASSISTED INGUINAL HERNIA REPAIR WITH MESH: SHX6706

## 2019-11-26 SURGERY — REPAIR, HERNIA, INGUINAL, ROBOT-ASSISTED, LAPAROSCOPIC, USING MESH
Anesthesia: General | Laterality: Right

## 2019-11-26 MED ORDER — CHLORHEXIDINE GLUCONATE 0.12 % MT SOLN
OROMUCOSAL | Status: AC
  Administered 2019-11-26: 15 mL via OROMUCOSAL
  Filled 2019-11-26: qty 15

## 2019-11-26 MED ORDER — DEXAMETHASONE SODIUM PHOSPHATE 10 MG/ML IJ SOLN
INTRAMUSCULAR | Status: AC
  Filled 2019-11-26: qty 1

## 2019-11-26 MED ORDER — GABAPENTIN 300 MG PO CAPS
ORAL_CAPSULE | ORAL | Status: AC
  Administered 2019-11-26: 300 mg via ORAL
  Filled 2019-11-26: qty 1

## 2019-11-26 MED ORDER — ONDANSETRON HCL 4 MG/2ML IJ SOLN
INTRAMUSCULAR | Status: AC
  Filled 2019-11-26: qty 2

## 2019-11-26 MED ORDER — CELECOXIB 200 MG PO CAPS
ORAL_CAPSULE | ORAL | Status: AC
  Administered 2019-11-26: 200 mg via ORAL
  Filled 2019-11-26: qty 1

## 2019-11-26 MED ORDER — FENTANYL CITRATE (PF) 100 MCG/2ML IJ SOLN
INTRAMUSCULAR | Status: AC
  Filled 2019-11-26: qty 2

## 2019-11-26 MED ORDER — ROCURONIUM BROMIDE 100 MG/10ML IV SOLN
INTRAVENOUS | Status: DC | PRN
  Administered 2019-11-26: 40 mg via INTRAVENOUS

## 2019-11-26 MED ORDER — BUPIVACAINE-EPINEPHRINE (PF) 0.25% -1:200000 IJ SOLN
INTRAMUSCULAR | Status: AC
  Filled 2019-11-26: qty 30

## 2019-11-26 MED ORDER — ACETAMINOPHEN 500 MG PO TABS
ORAL_TABLET | ORAL | Status: AC
  Administered 2019-11-26: 1000 mg via ORAL
  Filled 2019-11-26: qty 2

## 2019-11-26 MED ORDER — DEXAMETHASONE SODIUM PHOSPHATE 10 MG/ML IJ SOLN
INTRAMUSCULAR | Status: DC | PRN
  Administered 2019-11-26: 10 mg via INTRAVENOUS

## 2019-11-26 MED ORDER — BUPIVACAINE LIPOSOME 1.3 % IJ SUSP
INTRAMUSCULAR | Status: AC
  Filled 2019-11-26: qty 20

## 2019-11-26 MED ORDER — CEFAZOLIN SODIUM-DEXTROSE 2-4 GM/100ML-% IV SOLN
INTRAVENOUS | Status: AC
  Filled 2019-11-26: qty 100

## 2019-11-26 MED ORDER — ONDANSETRON HCL 4 MG/2ML IJ SOLN
INTRAMUSCULAR | Status: DC | PRN
  Administered 2019-11-26: 4 mg via INTRAVENOUS

## 2019-11-26 MED ORDER — SEVOFLURANE IN SOLN
RESPIRATORY_TRACT | Status: AC
  Filled 2019-11-26: qty 250

## 2019-11-26 MED ORDER — FENTANYL CITRATE (PF) 100 MCG/2ML IJ SOLN
INTRAMUSCULAR | Status: DC | PRN
  Administered 2019-11-26 (×2): 50 ug via INTRAVENOUS
  Administered 2019-11-26: 100 ug via INTRAVENOUS

## 2019-11-26 MED ORDER — KETOROLAC TROMETHAMINE 30 MG/ML IJ SOLN
INTRAMUSCULAR | Status: AC
  Filled 2019-11-26: qty 1

## 2019-11-26 MED ORDER — MIDAZOLAM HCL 2 MG/2ML IJ SOLN
INTRAMUSCULAR | Status: DC | PRN
  Administered 2019-11-26: 2 mg via INTRAVENOUS

## 2019-11-26 MED ORDER — HYDROCODONE-ACETAMINOPHEN 5-325 MG PO TABS: 1.0000 | ORAL_TABLET | Freq: Four times a day (QID) | ORAL | 0 refills | Status: DC | PRN

## 2019-11-26 MED ORDER — KETOROLAC TROMETHAMINE 30 MG/ML IJ SOLN
INTRAMUSCULAR | Status: DC | PRN
  Administered 2019-11-26: 30 mg via INTRAVENOUS

## 2019-11-26 MED ORDER — PROPOFOL 10 MG/ML IV BOLUS
INTRAVENOUS | Status: DC | PRN
  Administered 2019-11-26: 120 mg via INTRAVENOUS

## 2019-11-26 MED ORDER — ONDANSETRON HCL 4 MG/2ML IJ SOLN: 4.0000 mg | Freq: Once | INTRAMUSCULAR | Status: DC | PRN

## 2019-11-26 MED ORDER — FENTANYL CITRATE (PF) 100 MCG/2ML IJ SOLN: 25.0000 ug | INTRAMUSCULAR | Status: DC | PRN

## 2019-11-26 MED ORDER — BUPIVACAINE-EPINEPHRINE (PF) 0.25% -1:200000 IJ SOLN
INTRAMUSCULAR | Status: DC | PRN
  Administered 2019-11-26: 30 mL

## 2019-11-26 MED ORDER — ROCURONIUM BROMIDE 10 MG/ML (PF) SYRINGE
PREFILLED_SYRINGE | INTRAVENOUS | Status: AC
  Filled 2019-11-26: qty 10

## 2019-11-26 MED ORDER — LIDOCAINE HCL (PF) 2 % IJ SOLN
INTRAMUSCULAR | Status: AC
  Filled 2019-11-26: qty 5

## 2019-11-26 MED ORDER — FAMOTIDINE 20 MG PO TABS
ORAL_TABLET | ORAL | Status: AC
  Administered 2019-11-26: 20 mg via ORAL
  Filled 2019-11-26: qty 1

## 2019-11-26 MED ORDER — PROPOFOL 10 MG/ML IV BOLUS
INTRAVENOUS | Status: AC
  Filled 2019-11-26: qty 20

## 2019-11-26 MED ORDER — CHLORHEXIDINE GLUCONATE CLOTH 2 % EX PADS: 6.0000 | MEDICATED_PAD | Freq: Once | CUTANEOUS | Status: DC

## 2019-11-26 MED ORDER — LIDOCAINE HCL (CARDIAC) PF 100 MG/5ML IV SOSY
PREFILLED_SYRINGE | INTRAVENOUS | Status: DC | PRN
  Administered 2019-11-26: 80 mg via INTRAVENOUS

## 2019-11-26 MED ORDER — MIDAZOLAM HCL 2 MG/2ML IJ SOLN
INTRAMUSCULAR | Status: AC
  Filled 2019-11-26: qty 2

## 2019-11-26 MED ORDER — SUGAMMADEX SODIUM 200 MG/2ML IV SOLN
INTRAVENOUS | Status: DC | PRN
  Administered 2019-11-26: 200 mg via INTRAVENOUS

## 2019-11-26 MED ORDER — SODIUM CHLORIDE FLUSH 0.9 % IV SOLN
INTRAVENOUS | Status: AC
  Filled 2019-11-26: qty 10

## 2019-11-26 SURGICAL SUPPLY — 45 items
ADH SKN CLS APL DERMABOND .7 (GAUZE/BANDAGES/DRESSINGS) ×1
APL PRP STRL LF DISP 70% ISPRP (MISCELLANEOUS) ×1
BLADE CLIPPER SURG (BLADE) ×3 IMPLANT
CANISTER SUCT 1200ML W/VALVE (MISCELLANEOUS) IMPLANT
CHLORAPREP W/TINT 26 (MISCELLANEOUS) ×3 IMPLANT
COVER TIP SHEARS 8 DVNC (MISCELLANEOUS) ×1 IMPLANT
COVER TIP SHEARS 8MM DA VINCI (MISCELLANEOUS) ×2
COVER WAND RF STERILE (DRAPES) ×6 IMPLANT
DEFOGGER SCOPE WARMER CLEARIFY (MISCELLANEOUS) ×3 IMPLANT
DERMABOND ADVANCED (GAUZE/BANDAGES/DRESSINGS) ×2
DERMABOND ADVANCED .7 DNX12 (GAUZE/BANDAGES/DRESSINGS) ×1 IMPLANT
DRAPE 3/4 80X56 (DRAPES) IMPLANT
DRAPE ARM DVNC X/XI (DISPOSABLE) ×3 IMPLANT
DRAPE COLUMN DVNC XI (DISPOSABLE) ×1 IMPLANT
DRAPE DA VINCI XI ARM (DISPOSABLE) ×6
DRAPE DA VINCI XI COLUMN (DISPOSABLE) ×2
ELECT REM PT RETURN 9FT ADLT (ELECTROSURGICAL) ×3
ELECTRODE REM PT RTRN 9FT ADLT (ELECTROSURGICAL) ×1 IMPLANT
GLOVE ORTHO TXT STRL SZ7.5 (GLOVE) ×9 IMPLANT
GOWN STRL REUS W/ TWL LRG LVL3 (GOWN DISPOSABLE) ×3 IMPLANT
GOWN STRL REUS W/TWL LRG LVL3 (GOWN DISPOSABLE) ×9
GRASPER SUT TROCAR 14GX15 (MISCELLANEOUS) IMPLANT
IRRIGATION STRYKERFLOW (MISCELLANEOUS) IMPLANT
IRRIGATOR STRYKERFLOW (MISCELLANEOUS)
IV CATH ANGIO 14GX1.88 NO SAFE (IV SOLUTION) ×3 IMPLANT
IV NS 1000ML (IV SOLUTION)
IV NS 1000ML BAXH (IV SOLUTION) IMPLANT
KIT PINK PAD W/HEAD ARE REST (MISCELLANEOUS) ×3
KIT PINK PAD W/HEAD ARM REST (MISCELLANEOUS) ×1 IMPLANT
LABEL OR SOLS (LABEL) ×3 IMPLANT
MANIFOLD NEPTUNE II (INSTRUMENTS) ×3 IMPLANT
MESH 3DMAX LIGHT 4.1X6.2 RT LR (Mesh General) ×3 IMPLANT
NEEDLE HYPO 22GX1.5 SAFETY (NEEDLE) ×3 IMPLANT
NEEDLE INSUFFLATION 14GA 120MM (NEEDLE) IMPLANT
PACK LAP CHOLECYSTECTOMY (MISCELLANEOUS) ×3 IMPLANT
SEAL CANN UNIV 5-8 DVNC XI (MISCELLANEOUS) ×3 IMPLANT
SEAL XI 5MM-8MM UNIVERSAL (MISCELLANEOUS) ×6
SET TUBE SMOKE EVAC HIGH FLOW (TUBING) ×3 IMPLANT
SOLUTION ELECTROLUBE (MISCELLANEOUS) ×3 IMPLANT
SUT MNCRL 4-0 (SUTURE) ×3
SUT MNCRL 4-0 27XMFL (SUTURE) ×1
SUT VIC AB 0 CT2 27 (SUTURE) ×3 IMPLANT
SUT VLOC 90 S/L VL9 GS22 (SUTURE) ×3 IMPLANT
SUTURE MNCRL 4-0 27XMF (SUTURE) ×1 IMPLANT
TROCAR Z-THREAD FIOS 11X100 BL (TROCAR) ×3 IMPLANT

## 2019-11-26 NOTE — Transfer of Care (Signed)
Immediate Anesthesia Transfer of Care Note  Patient: William Osborne  Procedure(s) Performed: XI ROBOTIC ASSISTED INGUINAL HERNIA REPAIR WITH MESH (Right )  Patient Location: PACU  Anesthesia Type:General  Level of Consciousness: awake, alert  and oriented  Airway & Oxygen Therapy: Patient Spontanous Breathing and Patient connected to face mask oxygen  Post-op Assessment: Report given to RN and Post -op Vital signs reviewed and stable  Post vital signs: Reviewed and stable  Last Vitals:  Vitals Value Taken Time  BP 187/88 11/26/19 0927  Temp 36.4 C 11/26/19 0927  Pulse 75 11/26/19 0930  Resp 14 11/26/19 0930  SpO2 99 % 11/26/19 0930  Vitals shown include unvalidated device data.  Last Pain:  Vitals:   11/26/19 0619  TempSrc: Oral  PainSc: 1          Complications: No complications documented.

## 2019-11-26 NOTE — Interval H&P Note (Signed)
History and Physical Interval Note:  11/26/2019 7:23 AM  William Osborne  has presented today for surgery, with the diagnosis of Right inguinal hernia.  The various methods of treatment have been discussed with the patient and family. After consideration of risks, benefits and other options for treatment, the patient has consented to  Procedure(s): XI ROBOTIC ASSISTED INGUINAL HERNIA REPAIR WITH MESH (Right) as a surgical intervention.  The patient's history has been reviewed, patient examined, no change in status, stable for surgery.  I have reviewed the patient's chart and labs.  Questions were answered to the patient's satisfaction.   This may be a bilateral repair, and I reviewed this with the patient.   The right side is marked.   Campbell Lerner  Campbell Lerner, M.D., [facility]  11/26/2019 ; 7:24 AM

## 2019-11-26 NOTE — Anesthesia Preprocedure Evaluation (Signed)
Anesthesia Evaluation  Patient identified by MRN, date of birth, ID band Patient awake    Reviewed: Allergy & Precautions, NPO status , Patient's Chart, lab work & pertinent test results  Airway Mallampati: III       Dental  (+) Upper Dentures, Lower Dentures   Pulmonary Current Smoker and Patient abstained from smoking.,    Pulmonary exam normal        Cardiovascular negative cardio ROS Normal cardiovascular exam     Neuro/Psych negative neurological ROS  negative psych ROS   GI/Hepatic Neg liver ROS,   Endo/Other  negative endocrine ROS  Renal/GU negative Renal ROS  negative genitourinary   Musculoskeletal negative musculoskeletal ROS (+)   Abdominal Normal abdominal exam  (+)   Peds negative pediatric ROS (+)  Hematology negative hematology ROS (+)   Anesthesia Other Findings History reviewed. No pertinent past medical history.  Reproductive/Obstetrics                             Anesthesia Physical Anesthesia Plan  ASA: II  Anesthesia Plan: General   Post-op Pain Management:    Induction: Intravenous  PONV Risk Score and Plan:   Airway Management Planned: Oral ETT  Additional Equipment:   Intra-op Plan:   Post-operative Plan: Extubation in OR  Informed Consent: I have reviewed the patients History and Physical, chart, labs and discussed the procedure including the risks, benefits and alternatives for the proposed anesthesia with the patient or authorized representative who has indicated his/her understanding and acceptance.     Dental advisory given  Plan Discussed with: CRNA and Surgeon  Anesthesia Plan Comments:         Anesthesia Quick Evaluation

## 2019-11-26 NOTE — Anesthesia Postprocedure Evaluation (Signed)
Anesthesia Post Note  Patient: William Osborne  Procedure(s) Performed: XI ROBOTIC ASSISTED INGUINAL HERNIA REPAIR WITH MESH (Right )  Patient location during evaluation: PACU Anesthesia Type: General Level of consciousness: awake Pain management: satisfactory to patient Vital Signs Assessment: post-procedure vital signs reviewed and stable Respiratory status: spontaneous breathing and nonlabored ventilation Cardiovascular status: blood pressure returned to baseline Postop Assessment: no apparent nausea or vomiting Anesthetic complications: no   No complications documented.   Last Vitals:  Vitals:   11/26/19 0957 11/26/19 1019  BP: (!) 180/87 (!) 195/75  Pulse: 67 65  Resp: 15 16  Temp:  (!) 36.1 C  SpO2: 98% 100%    Last Pain:  Vitals:   11/26/19 1019  TempSrc: Tympanic  PainSc: 1                  Emilio Math

## 2019-11-26 NOTE — Discharge Instructions (Addendum)
AMBULATORY SURGERY  DISCHARGE INSTRUCTIONS   1) The drugs that you were given will stay in your system until tomorrow so for the next 24 hours you should not:  A) Drive an automobile B) Make any legal decisions C) Drink any alcoholic beverage   2) You may resume regular meals tomorrow.  Today it is better to start with liquids and gradually work up to solid foods.  You may eat anything you prefer, but it is better to start with liquids, then soup and crackers, and gradually work up to solid foods.   3) Please notify your doctor immediately if you have any unusual bleeding, trouble breathing, redness and pain at the surgery site, drainage, fever, or pain not relieved by medication.    4) Additional Instructions:  Take ibuprofen and tylenol regularly   May take pain medication if necessary.  Stool softener twice daily   Please contact your physician with any problems or Same Day Surgery at [phone], Monday through Friday 6 am to 4 pm, or [place] [facility] number at [phone].   Hernia, Adult     A hernia happens when tissue inside your body pushes out through a weak spot in your belly muscles (abdominal wall). This makes a round lump (bulge). The lump may be:  In a scar from surgery that was done in your belly (incisional hernia).  Near your belly button (umbilical hernia).  In your groin (inguinal hernia). Your groin is the area where your leg meets your lower belly (abdomen). This kind of hernia could also be: ? In your scrotum, if you are male. ? In folds of skin around your vagina, if you are male.  In your upper thigh (femoral hernia).  Inside your belly (hiatal hernia). This happens when your stomach slides above the muscle between your belly and your chest (diaphragm). If your hernia is small and it does not cause pain, you may not need treatment. If your hernia is large or it causes pain, you may need surgery. Follow these instructions at  home: Activity  Avoid stretching or overusing (straining) the muscles near your hernia. Straining can happen when you: ? Lift something heavy. ? Poop (have a bowel movement).  Do not lift anything that is heavier than 10 lb (4.5 kg), or the limit that you are told, until your doctor says that it is safe.  Use the strength of your legs when you lift something heavy. Do not use only your back muscles to lift. General instructions  Do these things if told by your doctor so you do not have trouble pooping (constipation): ? Drink enough fluid to keep your pee (urine) pale yellow. ? Eat foods that are high in fiber. These include fresh fruits and vegetables, whole grains, and beans. ? Limit foods that are high in fat and processed sugars. These include foods that are fried or sweet. ? Take medicine for trouble pooping.  When you cough, try to cough gently.  You may try to push your hernia in by very gently pressing on it when you are lying down. Do not try to force the bulge back in if it will not push in easily.  If you are overweight, work with your doctor to lose weight safely.  Do not use any products that have nicotine or tobacco in them. These include cigarettes and e-cigarettes. If you need help quitting, ask your doctor.  If you will be having surgery (hernia repair), watch your hernia for changes in  shape, size, or color. Tell your doctor if you see any changes.  Take over-the-counter and prescription medicines only as told by your doctor.  Keep all follow-up visits as told by your doctor. Contact a doctor if:  You get new pain, swelling, or redness near your hernia.  You poop fewer times in a week than normal.  You have trouble pooping.  You have poop (stool) that is more dry than normal.  You have poop that is harder or larger than normal. Get help right away if:  You have a fever.  You have belly pain that gets worse.  You feel sick to your stomach  (nauseous).  You throw up (vomit).  Your hernia cannot be pushed in by very gently pressing on it when you are lying down. Do not try to force the bulge back in if it will not push in easily.  Your hernia: ? Changes in shape or size. ? Changes color. ? Feels hard or it hurts when you touch it. These symptoms may represent a serious problem that is an emergency. Do not wait to see if the symptoms will go away. Get medical help right away. Call your local emergency services (911 in the [place] Summary  A hernia happens when tissue inside your body pushes out through a weak spot in the belly muscles. This creates a bulge.  If your hernia is small and it does not hurt, you may not need treatment. If your hernia is large or it hurts, you may need surgery.  If you will be having surgery, watch your hernia for changes in shape, size, or color. Tell your doctor about any changes. This information is not intended to replace advice given to you by your health care provider. Make sure you discuss any questions you have with your health care provider. Document Revised: 04/27/2018 Document Reviewed: 10/06/2016 Elsevier Patient Education  2020 [employer].

## 2019-11-26 NOTE — Op Note (Signed)
Robotic assisted Laparoscopic Transabdominal  right inguinal Hernia Repair with Mesh       Pre-operative Diagnosis:   Right inguinal Hernia   Post-operative Diagnosis: Same   Procedure: Robotic assisted Laparoscopic  repair of right inguinal hernia(s)   Surgeon: Campbell Lerner, M.D., FACS   Anesthesia: GETA   Findings:  Right indirect inguinal hernia, no evidence of left sided hernia.         Procedure Details  The patient was seen again in the Holding Room. The benefits, complications, treatment options, and expected outcomes were discussed with the patient. The risks of bleeding, infection, recurrence of symptoms, failure to resolve symptoms, recurrence of hernia, ischemic orchitis, chronic pain syndrome or neuroma, were reviewed again. The likelihood of improving the patient's symptoms with return to their baseline status is good.  The patient and/or family concurred with the proposed plan, giving informed consent.  The patient was taken to Operating Room, identified  and the procedure verified as Laparoscopic Inguinal Hernia Repair. Laterality confirmed.  A Time Out was held and the above information confirmed.   Prior to the induction of general anesthesia, antibiotic prophylaxis was administered. VTE prophylaxis was in place. General endotracheal anesthesia was then administered and tolerated well. After the induction, the abdomen was prepped with Chloraprep and draped in the sterile fashion. The patient was positioned in the supine position.   After local infiltration of quarter percent Marcaine with epinephrine, stab incision was made left upper quadrant.  Just below the costal margin approximately midclavicular line the Veress needle is passed with sensation of the layers to penetrate the abdominal wall and into the peritoneum.  Saline drop test is confirmed peritoneal placement.  Insufflation is initiated with carbon dioxide to pressures of 15 mmHg. An 8.5 mm port is placed to the  left off of the midline, with blunt tipped trocar.  Pneumoperitoneum maintained w/o HD changes. No evidence of bowel injuries.  Two 8.5 mm ports placed under direct vision. The laparoscopy revealed large right indirect defect(s).   The robot was brought ot the table and docked in the standard fashion, no collision between arms was observed. Instruments were kept under direct view at all times. For right inguinal hernia repair,  I developed a peritoneal flap. The sac(s) were reduced and dissected free from adjacent structures. We preserved the vas and the vessels, and visualized them to their convergence and beyond in the retroperitoneum. Once dissection was completed a large right sided BARD 3D Light mesh was placed and secured at three points with interrupted 2-0 Vicryl to the pubic tubercle and anteriorly. There was good coverage of the direct, indirect and femoral spaces. A large angiocath is placed under direct visualization in the groin to reduce trapped extraperitoneal air and confirm adequate peritoneal closure.  Second look revealed no complications or injuries.  There was good coverage of the direct, indirect and femoral spaces.  The flap was then closed with 2-0 V-lock suture.  Peritoneal closure without defects.  Once assuring that hemostasis was adequate, all needles/sponges removed, and the robot was undocked.  The ports were removed, the abdomen desulflated.  4-0 subcuticular Monocryl was used at all skin edges. Dermabond was placed.  Patient tolerated the procedure well. There were no complications. He was taken to the recovery room in stable condition.           Campbell Lerner, M.D., FACS 11/26/2019, 9:30 AM

## 2019-11-26 NOTE — Anesthesia Procedure Notes (Signed)
Procedure Name: Intubation Date/Time: 11/26/2019 7:50 AM Performed by: Henrietta Hoover, CRNA Pre-anesthesia Checklist: Patient identified, Patient being monitored, Timeout performed, Emergency Drugs available and Suction available Patient Re-evaluated:Patient Re-evaluated prior to induction Oxygen Delivery Method: Circle system utilized Preoxygenation: Pre-oxygenation with 100% oxygen Induction Type: IV induction Ventilation: Mask ventilation without difficulty Laryngoscope Size: McGraph and 4 Grade View: Grade II Tube type: Oral Tube size: 7.5 mm Number of attempts: 1 Airway Equipment and Method: Stylet Placement Confirmation: ETT inserted through vocal cords under direct vision,  positive ETCO2 and breath sounds checked- equal and bilateral Secured at: 21 cm Tube secured with: Tape Dental Injury: Teeth and Oropharynx as per pre-operative assessment

## 2019-12-10 ENCOUNTER — Encounter: Payer: Self-pay | Admitting: Physician Assistant

## 2019-12-10 ENCOUNTER — Other Ambulatory Visit: Payer: Self-pay

## 2019-12-10 ENCOUNTER — Ambulatory Visit (INDEPENDENT_AMBULATORY_CARE_PROVIDER_SITE_OTHER): Payer: PRIVATE HEALTH INSURANCE | Admitting: Physician Assistant

## 2019-12-10 VITALS — BP 179/85 | HR 80 | Temp 98.0°F | Ht 61.0 in | Wt 137.2 lb

## 2019-12-10 DIAGNOSIS — Z09 Encounter for follow-up examination after completed treatment for conditions other than malignant neoplasm: Secondary | ICD-10-CM

## 2019-12-10 DIAGNOSIS — K4090 Unilateral inguinal hernia, without obstruction or gangrene, not specified as recurrent: Secondary | ICD-10-CM

## 2019-12-10 NOTE — Progress Notes (Signed)
[facility] POST-OP OFFICE VISIT  12/10/2019  HPI: William Osborne is a 64 y.o. male 14 days s/p robotic assisted laparoscopic right inguinal hernia repair with Dr Claudine Mouton  He is doing well overall Pain was controlled with Advil alone and never required narcotic medications. He stopped taking Advil around 2 days ago.  No fever, chills, nausea, emesis He has some constipation initially but this was relieved with OTC medications No issues with incisions Tolerating PO No other complaints.   Vital signs: BP (!) 179/85   Pulse 80   Temp 98 F (36.7 C) (Oral)   Ht 5\' 1"  (1.549 m)   Wt 137 lb 3.2 oz (62.2 kg)   SpO2 97%   BMI 25.92 kg/m    Physical Exam: Constitutional: Well appearing male, NAD Abdomen: Soft, non-tender, non-distended, no rebound/guarding. No recurrence in right groin.  Skin: Laparoscopic incisions are well healed, no erythema or drainage   Assessment/Plan: This is a 64 y.o. male 14 days s/p robotic assisted laparoscopic right inguinal hernia repair   - Pain control prn; NSAIDs +/- Tylonl and Ice  - Reviewed wound care  - Reviewed lifting restrictions; recommend total of 6 weeks  - rtc prn   -- 69, PA-C [facility] 12/10/2019, 10:33 AM [phone] M-F: 7am - 4pm

## 2019-12-10 NOTE — Patient Instructions (Addendum)
William Osborne advised patient if he has pain to take Ibuprofen or Tylenol. Patient is to still to refrain from any heavy lifting, bending, pushing or pulling for a total of six weeks total after surgery. Patient resume with taking showers and submerging the area in water.  GENERAL POST-OPERATIVE PATIENT INSTRUCTIONS   WOUND CARE INSTRUCTIONS:  Keep a dry clean dressing on the wound if there is drainage. The initial bandage may be removed after 24 hours.  Once the wound has quit draining you may leave it open to air.  If clothing rubs against the wound or causes irritation and the wound is not draining you may cover it with a dry dressing during the daytime.  Try to keep the wound dry and avoid ointments on the wound unless directed to do so.  If the wound becomes bright red and painful or starts to drain infected material that is not clear, please contact your physician immediately.  If the wound is mildly pink and has a thick firm ridge underneath it, this is normal, and is referred to as a healing ridge.  This will resolve over the next 4-6 weeks.  BATHING: You may shower if you have been informed of this by your surgeon. However, Please do not submerge in a tub, hot tub, or pool until incisions are completely sealed or have been told by your surgeon that you may do so.  DIET:  You may eat any foods that you can tolerate.  It is a good idea to eat a high fiber diet and take in plenty of fluids to prevent constipation.  If you do become constipated you may want to take a mild laxative or take ducolax tablets on a daily basis until your bowel habits are regular.  Constipation can be very uncomfortable, along with straining, after recent surgery.  ACTIVITY:  You are encouraged to cough and deep breath or use your incentive spirometer if you were given one, every 15-30 minutes when awake.  This will help prevent respiratory complications and low grade fevers post-operatively if you had a general  anesthetic.  You may want to hug a pillow when coughing and sneezing to add additional support to the surgical area, if you had abdominal or chest surgery, which will decrease pain during these times.  You are encouraged to walk and engage in light activity for the next two weeks.  You should not lift more than 20 pounds, until 4 to 6 weeks after surgery as it could put you at increased risk for complications.  Twenty pounds is roughly equivalent to a plastic bag of groceries. At that time- Listen to your body when lifting, if you have pain when lifting, stop and then try again in a few days. Soreness after doing exercises or activities of daily living is normal as you get back in to your normal routine.  MEDICATIONS:  Try to take narcotic medications and anti-inflammatory medications, such as tylenol, ibuprofen, naprosyn, etc., with food.  This will minimize stomach upset from the medication.  Should you develop nausea and vomiting from the pain medication, or develop a rash, please discontinue the medication and contact your physician.  You should not drive, make important decisions, or operate machinery when taking narcotic pain medication.  SUNBLOCK Use sun block to incision area over the next year if this area will be exposed to sun. This helps decrease scarring and will allow you avoid a permanent darkened area over your incision.  QUESTIONS:  Please feel free  to call our office if you have any questions, and we will be glad to assist you. [phone].

## 2019-12-18 ENCOUNTER — Telehealth: Payer: Self-pay | Admitting: *Deleted

## 2019-12-18 NOTE — Telephone Encounter (Signed)
Faxed FMLA to [facility] Team at [phone]

## 2019-12-25 ENCOUNTER — Telehealth: Payer: Self-pay | Admitting: Surgery

## 2019-12-25 NOTE — Telephone Encounter (Signed)
Patient is calling and is needing a doctors note to return to work patient needs one faxed and needs to come by and pick one up, fax number [phone]. Please call patient and advise.

## 2019-12-25 NOTE — Telephone Encounter (Signed)
Per Dr.Rodenberg OK to send return to work nore for patient with no restrictions. Patient return to work note was faxed over to [phone] and also placed at the front with front desk staff to pick up a copy of the letter as well. Patient was notified and verbalized understanding.

## 2021-04-25 IMAGING — CT CT ABD-PELV W/ CM
2 of 5 series · 15 of 46 positions shown, 17 images · IV contrast (APPLIED)
Comparison: None.

CLINICAL DATA: Right lower quadrant pain. Appendicitis suspected.
Known right inguinal hernia. Increased swelling.

EXAM:
CT ABDOMEN AND PELVIS WITH CONTRAST
TECHNIQUE: Multidetector CT imaging of the abdomen and pelvis was performed
using the standard protocol following bolus administration of
intravenous contrast.
CONTRAST:  100mL OMNIPAQUE IOHEXOL 300 MG/ML  SOLN

[Series 2: routine abd/pel with · axial · 0.69mm/px · z∈[-496,-80]mm · 12 of 93 slices shown, 14 images]
[im 5/93  soft-tissue]
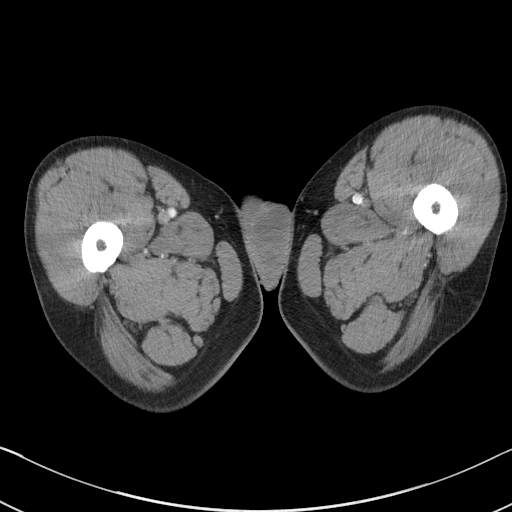
[im 5/93  bone]
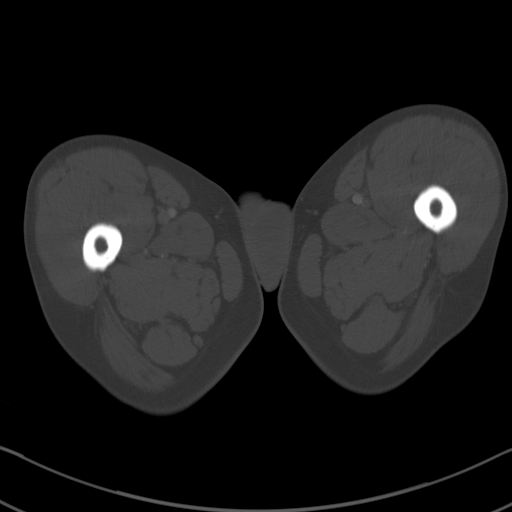
[im 15/93  soft-tissue]
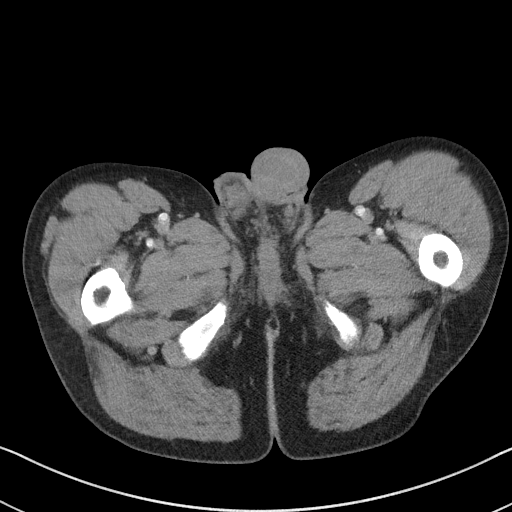
[im 20/93  soft-tissue]
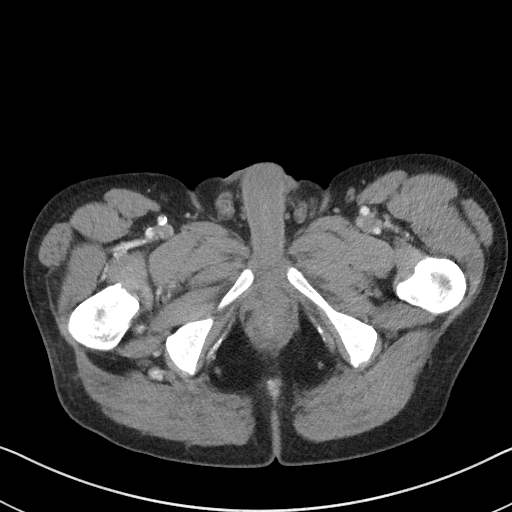
[im 30/93  soft-tissue]
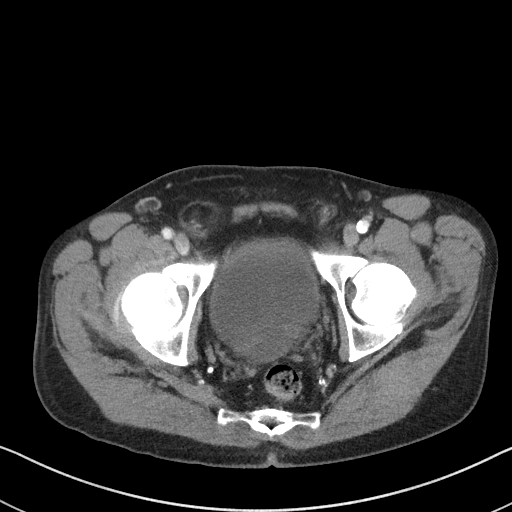
[im 34/93  soft-tissue]
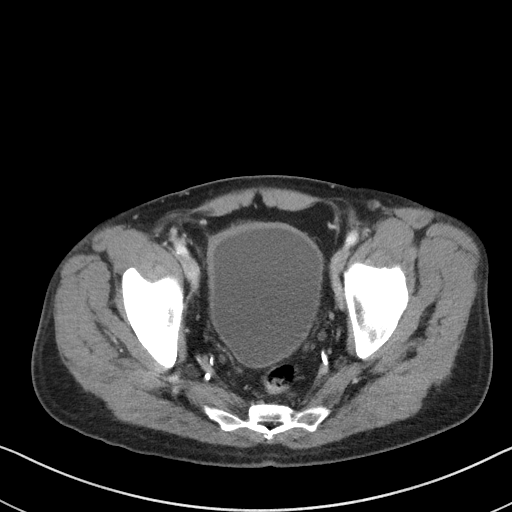
[im 44/93  soft-tissue]
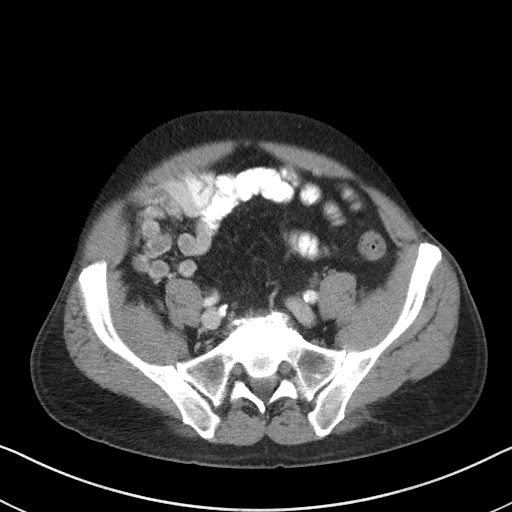
[im 49/93  soft-tissue]
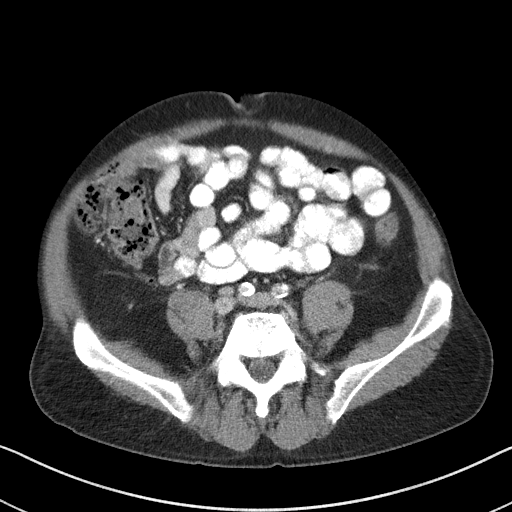
[im 59/93  soft-tissue]
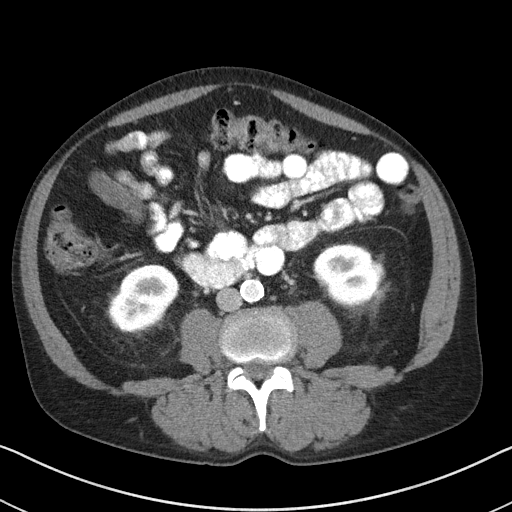
[im 63/93  soft-tissue]
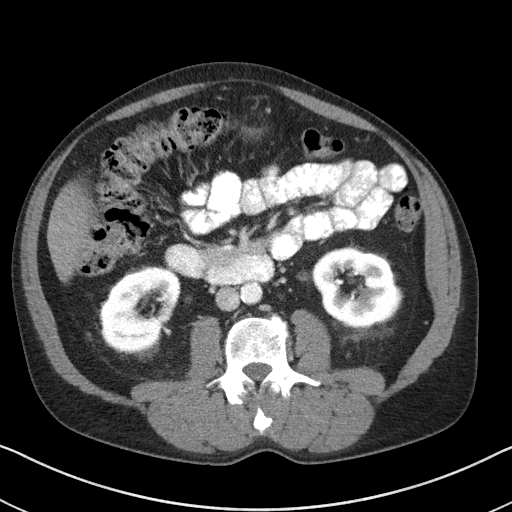
[im 63/93  bone]
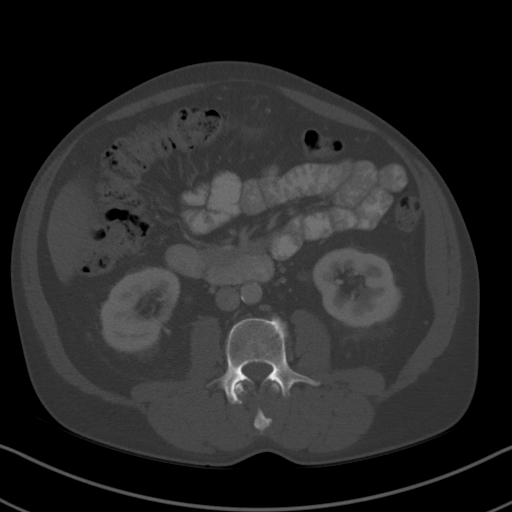
[im 73/93  soft-tissue]
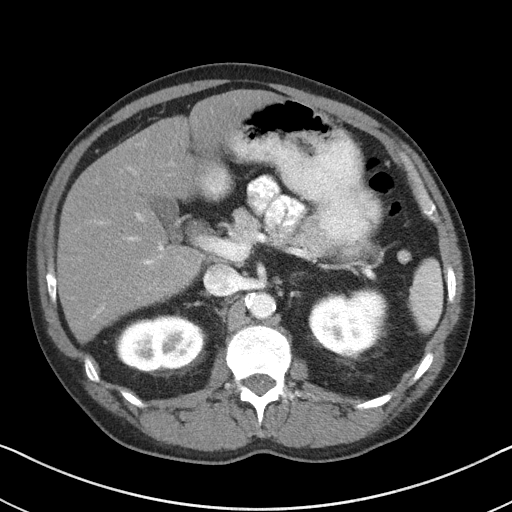
[im 78/93  soft-tissue]
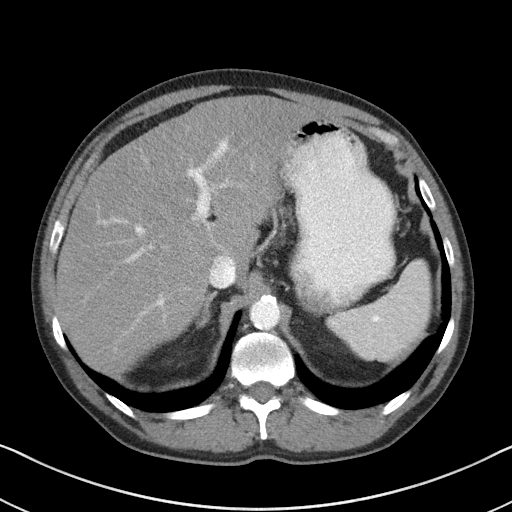
[im 88/93  soft-tissue]
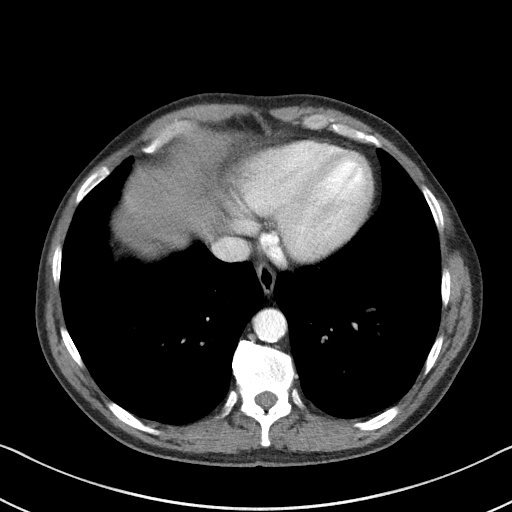

[Series 5: coronal st · coronal · 0.67mm/px · 3 of 96 slices shown]
[im 32/96  soft-tissue]
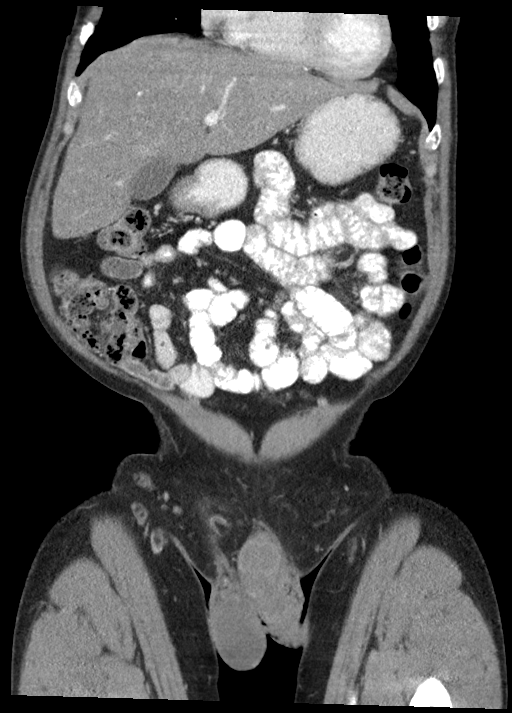
[im 43/96  soft-tissue]
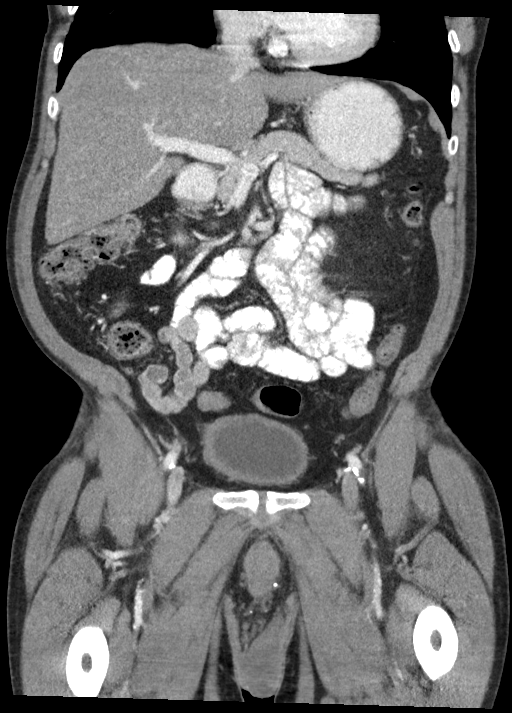
[im 53/96  soft-tissue]
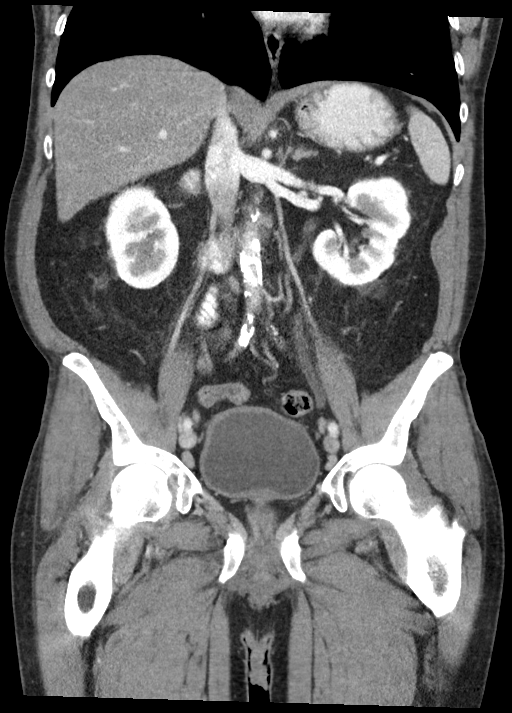

[15 of 46 positions shown; findings below may reference images not displayed]

FINDINGS: Lower chest: No acute abnormality.

Hepatobiliary: Hepatic steatosis is identified. The liver,
gallbladder, and portal vein are otherwise normal.

Pancreas: Unremarkable. No pancreatic ductal dilatation or
surrounding inflammatory changes.

Spleen: Normal in size without focal abnormality.

Adrenals/Urinary Tract: Adrenal glands are within normal limits.
Probable small cyst in the lower pole the right kidney on series 2,
image 34 but too small to completely characterize. No suspicious
masses are seen in either kidney. No renal stones or hydronephrosis.
Mild bilateral perinephric stranding is symmetric and probably
nonacute. The ureters are normal. Mild thickening of the anterior
bladder, particularly to the right.

Stomach/Bowel: Stomach is within normal limits. Appendix appears
normal. No evidence of bowel wall thickening, distention, or
inflammatory changes.

Vascular/Lymphatic: Significant atherosclerosis is seen in the
nonaneurysmal aorta. No dissection. Atherosclerotic change extends
into the iliac and femoral vessels. No adenopathy.

Reproductive: Prostate is unremarkable.

Other: There is a fat containing right inguinal hernia. No free air
or free fluid. A small periumbilical hernia is identified, also
containing fat.

Musculoskeletal: No acute or significant osseous findings.
IMPRESSION: 1. The anterior wall of the bladder is mildly thickened which is
nonspecific. Sequela of cystitis not excluded. Recommend correlation
with clinical history and urinalysis.
2. The appendix is normal in appearance.  No appendicitis.
3. Hepatic steatosis.
4. Significant atherosclerosis in the nonaneurysmal aorta.
5. Relatively small fat containing right inguinal hernia.

## 2022-04-27 ENCOUNTER — Encounter: Payer: Self-pay | Admitting: Emergency Medicine

## 2022-04-27 ENCOUNTER — Other Ambulatory Visit: Payer: Self-pay

## 2022-04-27 ENCOUNTER — Emergency Department: Payer: Medicare Other

## 2022-04-27 ENCOUNTER — Emergency Department
Admission: EM | Admit: 2022-04-27 | Discharge: 2022-04-27 | Disposition: A | Discharge status: Home / Self Care | Payer: Medicare Other | Attending: Emergency Medicine | Admitting: Emergency Medicine

## 2022-04-27 DIAGNOSIS — M25561 Pain in right knee: Secondary | ICD-10-CM | POA: Insufficient documentation

## 2022-04-27 MED ORDER — ETODOLAC 400 MG PO TABS: 400.0000 mg | ORAL_TABLET | Freq: Two times a day (BID) | ORAL | 0 refills | Status: DC

## 2022-04-27 NOTE — ED Provider Notes (Signed)
[facility] Provider Note    None    (approximate)   History   Leg Swelling   HPI  William Osborne is a 67 y.o. male presents to the ED with complaint of right knee pain after working on some hardwood floors.  Patient states that he did have knee pads on but that they did not fit and would move around at times.  He denies any other injury to his knee.  No prior problems with his knee and patient continues to ambulate.     Physical Exam   Triage Vital Signs: ED Triage Vitals  Enc Vitals Group     BP 04/27/22 0538 (!) 151/65     Pulse Rate 04/27/22 0538 68     Resp 04/27/22 0538 18     Temp 04/27/22 0538 98.2 F (36.8 C)     Temp Source 04/27/22 0538 Oral     SpO2 04/27/22 0538 98 %     Weight 04/27/22 0541 140 lb (63.5 kg)     Height 04/27/22 0541 5\' 1"  (1.549 m)     Head Circumference --      Peak Flow --      Pain Score 04/27/22 0541 5     Pain Loc --      Pain Edu? --      Excl. in GC? --     Most recent vital signs: Vitals:   04/27/22 0538  BP: (!) 151/65  Pulse: 68  Resp: 18  Temp: 98.2 F (36.8 C)  SpO2: 98%     General: Awake, no distress.  CV:  Good peripheral perfusion.  Heart regular rate and rhythm. Resp:  Normal effort.  Lungs are clear bilaterally. Abd:  No distention.  Other:  Right lower extremity without erythema, warmth or discoloration.  There is some tenderness on palpation of the anterior knee but no effusion or tenderness to the infrapatellar tendon.  Range of motion is with minimal crepitus.  Skin is intact.   ED Results / Procedures / Treatments   Labs (all labs ordered are listed, but only abnormal results are displayed) Labs Reviewed - No data to display    RADIOLOGY  Venous ultrasound per radiologist is negative for DVT.   PROCEDURES:  Critical Care performed:   Procedures   MEDICATIONS ORDERED IN ED: Medications - No data to display   IMPRESSION / MDM / ASSESSMENT AND PLAN /  ED COURSE  I reviewed the triage vital signs and the nursing notes.   Differential diagnosis includes, but is not limited to, DVT right lower extremity, contusion, effusion, cellulitis, osteoarthritis, bursitis, tendinitis.  67 year old male presents to the ED with complaint of right knee pain after working on some hardwood floors for several days.  Ultrasound was reassuring and patient was made aware that he does not have a DVT.  Patient is able to stand and ambulate without any assistance and physical exam was benign with exception of some tenderness on palpation of the anterior knee.  A prescription for etodolac 400 mg twice daily with food was sent to the pharmacy for him to begin taking.  He is to follow-up with Dr. Martha Clan if not improving.      Patient's presentation is most consistent with acute complicated illness / injury requiring diagnostic workup.  FINAL CLINICAL IMPRESSION(S) / ED DIAGNOSES   Final diagnoses:  Acute pain of right knee     Rx / DC Orders   ED Discharge Orders  Ordered    etodolac (LODINE) 400 MG tablet  2 times daily        04/27/22 0100             Note:  This document was prepared using Dragon voice recognition software and may include unintentional dictation errors.   Tommi Rumps, PA-C 04/27/22 [id number]    Jene Every, MD 04/27/22 [phone]

## 2022-04-27 NOTE — ED Triage Notes (Signed)
Patient ambulatory to triage with limping gait, without distress noted; pt reports about a wk ago he was working on [employer] for several days, kneeling; began having swelling to rt knee with a knot noted to lateral aspect, now with swelling to upper rt leg as well; denies redness to area only swelling and pain with wt bearing

## 2022-04-27 NOTE — Discharge Instructions (Addendum)
Follow-up with Dr. Martha Clan if any continued problems with your knee or not improving.  Wear knee pads when down on the floor working to protect your knee.  A prescription for an anti-inflammatory was sent to the pharmacy for you to begin taking.  Make sure you take this medication with food.  You may use ice on your knee as needed for discomfort.

## 2023-03-11 ENCOUNTER — Inpatient Hospital Stay
Admission: EM | Admit: 2023-03-11 | Discharge: 2023-03-14 | DRG: 153 | Disposition: A | Discharge status: Home / Self Care | Payer: Medicare HMO | Attending: Internal Medicine | Admitting: Internal Medicine

## 2023-03-11 ENCOUNTER — Other Ambulatory Visit: Payer: Self-pay

## 2023-03-11 ENCOUNTER — Emergency Department: Payer: Medicare HMO

## 2023-03-11 DIAGNOSIS — J0430 Supraglottitis, unspecified, without obstruction: Principal | ICD-10-CM | POA: Diagnosis present

## 2023-03-11 DIAGNOSIS — Z1152 Encounter for screening for COVID-19: Secondary | ICD-10-CM

## 2023-03-11 DIAGNOSIS — F17210 Nicotine dependence, cigarettes, uncomplicated: Secondary | ICD-10-CM | POA: Diagnosis present

## 2023-03-11 DIAGNOSIS — I10 Essential (primary) hypertension: Secondary | ICD-10-CM | POA: Diagnosis present

## 2023-03-11 DIAGNOSIS — E871 Hypo-osmolality and hyponatremia: Secondary | ICD-10-CM | POA: Diagnosis present

## 2023-03-11 DIAGNOSIS — R1310 Dysphagia, unspecified: Secondary | ICD-10-CM | POA: Diagnosis present

## 2023-03-11 DIAGNOSIS — E7849 Other hyperlipidemia: Secondary | ICD-10-CM | POA: Diagnosis not present

## 2023-03-11 DIAGNOSIS — E785 Hyperlipidemia, unspecified: Secondary | ICD-10-CM | POA: Diagnosis present

## 2023-03-11 DIAGNOSIS — E782 Mixed hyperlipidemia: Secondary | ICD-10-CM | POA: Diagnosis not present

## 2023-03-11 DIAGNOSIS — J0431 Supraglottitis, unspecified, with obstruction: Secondary | ICD-10-CM

## 2023-03-11 LAB — RESPIRATORY PANEL BY PCR

## 2023-03-11 LAB — CBC WITH DIFFERENTIAL/PLATELET
Abs Immature Granulocytes: 0.11 10*3/uL — ABNORMAL HIGH (ref 0.00–0.07)
Basophils Absolute: 0.1 10*3/uL (ref 0.0–0.1)
Basophils Relative: 1 %
Eosinophils Absolute: 0 10*3/uL (ref 0.0–0.5)
Eosinophils Relative: 0 %
HCT: 40.8 % (ref 39.0–52.0)
Hemoglobin: 13.6 g/dL (ref 13.0–17.0)
Immature Granulocytes: 1 %
Lymphocytes Relative: 11 %
Lymphs Abs: 1.7 10*3/uL (ref 0.7–4.0)
MCH: 26.7 pg (ref 26.0–34.0)
MCHC: 33.3 g/dL (ref 30.0–36.0)
MCV: 80.2 fL (ref 80.0–100.0)
Monocytes Absolute: 1.6 10*3/uL — ABNORMAL HIGH (ref 0.1–1.0)
Monocytes Relative: 10 %
Neutro Abs: 12.7 10*3/uL — ABNORMAL HIGH (ref 1.7–7.7)
Neutrophils Relative %: 77 %
Platelets: 242 10*3/uL (ref 150–400)
RBC: 5.09 MIL/uL (ref 4.22–5.81)
RDW: 14.2 % (ref 11.5–15.5)
WBC: 16.2 10*3/uL — ABNORMAL HIGH (ref 4.0–10.5)
nRBC: 0 % (ref 0.0–0.2)

## 2023-03-11 LAB — COMPREHENSIVE METABOLIC PANEL
ALT: 21 U/L (ref 0–44)
AST: 23 U/L (ref 15–41)
Albumin: 3.8 g/dL (ref 3.5–5.0)
Alkaline Phosphatase: 56 U/L (ref 38–126)
Anion gap: 11 (ref 5–15)
BUN: 18 mg/dL (ref 8–23)
CO2: 23 mmol/L (ref 22–32)
Calcium: 9.4 mg/dL (ref 8.9–10.3)
Chloride: 99 mmol/L (ref 98–111)
Creatinine, Ser: 1.02 mg/dL (ref 0.61–1.24)
GFR, Estimated: >60 mL/min (ref >60)
Glucose, Bld: 108 mg/dL — ABNORMAL HIGH (ref 70–99)
Potassium: 3.6 mmol/L (ref 3.5–5.1)
Sodium: 133 mmol/L — ABNORMAL LOW (ref 135–145)
Total Bilirubin: 1.1 mg/dL (ref 0.0–1.2)
Total Protein: 7.8 g/dL (ref 6.5–8.1)

## 2023-03-11 LAB — MRSA NEXT GEN BY PCR, NASAL: MRSA by PCR Next Gen: NOT DETECTED

## 2023-03-11 LAB — GROUP A STREP BY PCR: Group A Strep by PCR: NOT DETECTED

## 2023-03-11 LAB — RESP PANEL BY RT-PCR (RSV, FLU A&B, COVID)  RVPGX2
Influenza A by PCR: NEGATIVE
Influenza B by PCR: NEGATIVE
Resp Syncytial Virus by PCR: NEGATIVE
SARS Coronavirus 2 by RT PCR: NEGATIVE

## 2023-03-11 LAB — LACTIC ACID, PLASMA
Lactic Acid, Venous: 1.4 mmol/L (ref 0.5–1.9)
Lactic Acid, Venous: 1.6 mmol/L (ref 0.5–1.9)

## 2023-03-11 LAB — GLUCOSE, CAPILLARY: Glucose-Capillary: 155 mg/dL — ABNORMAL HIGH (ref 70–99)

## 2023-03-11 LAB — MONONUCLEOSIS SCREEN: Mono Screen: NEGATIVE

## 2023-03-11 MED ORDER — DEXAMETHASONE SODIUM PHOSPHATE 10 MG/ML IJ SOLN
10.0000 mg | Freq: Four times a day (QID) | INTRAMUSCULAR | Status: DC
  Administered 2023-03-11 – 2023-03-12 (×3): 10 mg via INTRAVENOUS
  Filled 2023-03-11 (×4): qty 1

## 2023-03-11 MED ORDER — POLYETHYLENE GLYCOL 3350 17 G PO PACK: 17.0000 g | PACK | Freq: Every day | ORAL | Status: DC | PRN

## 2023-03-11 MED ORDER — VANCOMYCIN HCL IN DEXTROSE 1-5 GM/200ML-% IV SOLN: 1000.0000 mg | INTRAVENOUS | Status: DC

## 2023-03-11 MED ORDER — VANCOMYCIN HCL IN DEXTROSE 1-5 GM/200ML-% IV SOLN
1000.0000 mg | INTRAVENOUS | Status: DC
  Administered 2023-03-12: 1000 mg via INTRAVENOUS
  Filled 2023-03-11 (×2): qty 200

## 2023-03-11 MED ORDER — IOHEXOL 300 MG/ML  SOLN
75.0000 mL | Freq: Once | INTRAMUSCULAR | Status: AC | PRN
  Administered 2023-03-11: 75 mL via INTRAVENOUS

## 2023-03-11 MED ORDER — ONDANSETRON HCL 4 MG/2ML IJ SOLN
4.0000 mg | Freq: Once | INTRAMUSCULAR | Status: AC
  Administered 2023-03-11: 4 mg via INTRAVENOUS
  Filled 2023-03-11: qty 2

## 2023-03-11 MED ORDER — HYDRALAZINE HCL 20 MG/ML IJ SOLN: 5.0000 mg | Freq: Four times a day (QID) | INTRAMUSCULAR | Status: DC | PRN

## 2023-03-11 MED ORDER — FENTANYL CITRATE PF 50 MCG/ML IJ SOSY
25.0000 ug | PREFILLED_SYRINGE | Freq: Once | INTRAMUSCULAR | Status: AC
  Administered 2023-03-11: 25 ug via INTRAVENOUS
  Filled 2023-03-11: qty 1

## 2023-03-11 MED ORDER — VANCOMYCIN HCL 1500 MG/300ML IV SOLN
1500.0000 mg | Freq: Once | INTRAVENOUS | Status: AC
  Administered 2023-03-11: 1500 mg via INTRAVENOUS
  Filled 2023-03-11: qty 300

## 2023-03-11 MED ORDER — ENOXAPARIN SODIUM 40 MG/0.4ML IJ SOSY
40.0000 mg | PREFILLED_SYRINGE | INTRAMUSCULAR | Status: DC
  Administered 2023-03-11 – 2023-03-13 (×3): 40 mg via SUBCUTANEOUS
  Filled 2023-03-11 (×3): qty 0.4

## 2023-03-11 MED ORDER — SODIUM CHLORIDE 0.9 % IV SOLN
3.0000 g | Freq: Once | INTRAVENOUS | Status: AC
  Administered 2023-03-11: 3 g via INTRAVENOUS
  Filled 2023-03-11: qty 8

## 2023-03-11 MED ORDER — ONDANSETRON HCL 4 MG/2ML IJ SOLN: 4.0000 mg | Freq: Four times a day (QID) | INTRAMUSCULAR | Status: DC | PRN

## 2023-03-11 MED ORDER — DEXAMETHASONE SODIUM PHOSPHATE 10 MG/ML IJ SOLN
10.0000 mg | Freq: Once | INTRAMUSCULAR | Status: AC
  Administered 2023-03-11: 10 mg via INTRAVENOUS
  Filled 2023-03-11: qty 1

## 2023-03-11 MED ORDER — ONDANSETRON HCL 4 MG PO TABS: 4.0000 mg | ORAL_TABLET | Freq: Four times a day (QID) | ORAL | Status: DC | PRN

## 2023-03-11 MED ORDER — MORPHINE SULFATE (PF) 2 MG/ML IV SOLN: 2.0000 mg | INTRAVENOUS | Status: DC | PRN

## 2023-03-11 MED ORDER — CHLORHEXIDINE GLUCONATE CLOTH 2 % EX PADS
6.0000 | MEDICATED_PAD | Freq: Every day | CUTANEOUS | Status: DC
  Administered 2023-03-12 – 2023-03-13 (×2): 6 via TOPICAL

## 2023-03-11 MED ORDER — SODIUM CHLORIDE 0.9 % IV SOLN
2.0000 g | INTRAVENOUS | Status: DC
  Administered 2023-03-11 – 2023-03-13 (×3): 2 g via INTRAVENOUS
  Filled 2023-03-11 (×5): qty 20

## 2023-03-11 NOTE — Consult Note (Signed)
Pharmacy Antibiotic Note  William Osborne is a 68 y.o. male admitted on 03/11/2023 with  swelling of the neck .  Family reports they noticed it about 48 hours ago. He is having increasing difficulty with speaking and shortness of breath secondary to swelling in his neck. Pharmacy has been consulted for Vancomycin dosing.  Plan: Vancomycin 1500mg  IV x 1 as loading dose, followed by: 1000 mg IV Q 24 hrs. Goal AUC 400-550. Expected AUC: 454.3 Expected Cmin: 10.6 SCr used: 1.02    Height: 5\' 1"  (154.9 cm) Weight: 63.5 kg (140 lb) IBW/kg (Calculated) : 52.3  Temp (24hrs), Avg:98.3 F (36.8 C), Min:98 F (36.7 C), Max:98.6 F (37 C)  Recent Labs  Lab 03/11/23 1131 03/11/23 1355  WBC 16.2*  --   CREATININE 1.02  --   LATICACIDVEN  --  1.4    Estimated Creatinine Clearance: 57.2 mL/min (by C-G formula based on SCr of 1.02 mg/dL).    No Known Allergies  Antimicrobials this admission: Unasyn 2/21 x 1 Vancomycin 2/21 >>   Dose adjustments this admission: N/A  Microbiology results: 2/21 BCx: collected 2/21 Group A Strep PCR: negative    Thank you for allowing pharmacy to be a part of this patient's care.  Anand Tejada A Tyrion Glaude 03/11/2023 3:02 PM

## 2023-03-11 NOTE — ED Provider Notes (Signed)
[facility] Provider Note    Event Date/Time   First MD Initiated Contact with Patient 03/11/23 1056     (approximate)   History   Oral Swelling   HPI  William Osborne is a 68 y.o. male  with HTN and HLD who comes in with swelling of the neck.  Family reports they noticed it about 48 hours ago.  He is having increasing difficulty with speaking and shortness of breath secondary to swelling in his neck.  He denies ever having anything like this previously.  Does have dentures but states he just glued them into they be difficult to remove.  Did report a temperature this morning and took Tylenol before coming in.   Physical Exam   Triage Vital Signs: ED Triage Vitals  Encounter Vitals Group     BP 03/11/23 1040 118/70     Systolic BP Percentile --      Diastolic BP Percentile --      Pulse Rate 03/11/23 1040 97     Resp 03/11/23 1040 18     Temp 03/11/23 1040 98 F (36.7 C)     Temp src --      SpO2 03/11/23 1040 94 %     Weight 03/11/23 1039 140 lb (63.5 kg)     Height 03/11/23 1039 5\' 1"  (1.549 m)     Head Circumference --      Peak Flow --      Pain Score 03/11/23 1038 0     Pain Loc --      Pain Education --      Exclude from Growth Chart --     Most recent vital signs: Vitals:   03/11/23 1040  BP: 118/70  Pulse: 97  Resp: 18  Temp: 98 F (36.7 C)  SpO2: 94%     General: Awake, no distress.  CV:  Good peripheral perfusion.  Resp:  Normal effort.  Abd:  No distention.  Other:  Bilateral palpable masses noted on the submandibular area worse on the left than the right.  Oral pharynx shows midline uvula   ED Results / Procedures / Treatments   Labs (all labs ordered are listed, but only abnormal results are displayed) Labs Reviewed  GROUP A STREP BY PCR  RESP PANEL BY RT-PCR (RSV, FLU A&B, COVID)  RVPGX2  CBC WITH DIFFERENTIAL/PLATELET  COMPREHENSIVE METABOLIC PANEL  MONONUCLEOSIS SCREEN     RADIOLOGY I have  reviewed the CT personally interpreted + edema    PROCEDURES:  Critical Care performed: Yes, see critical care procedure note(s)  .Critical Care  Performed by: Concha Se, MD Authorized by: Concha Se, MD   Critical care provider statement:    Critical care time (minutes):  30   Critical care was necessary to treat or prevent imminent or life-threatening deterioration of the following conditions: airway swelling.   Critical care was time spent personally by me on the following activities:  Development of treatment plan with patient or surrogate, discussions with consultants, evaluation of patient's response to treatment, examination of patient, ordering and review of laboratory studies, ordering and review of radiographic studies, ordering and performing treatments and interventions, pulse oximetry, re-evaluation of patient's condition and review of old charts    MEDICATIONS ORDERED IN ED: Medications  Ampicillin-Sulbactam (UNASYN) 3 g in sodium chloride 0.9 % 100 mL IVPB (3 g Intravenous New Bag/Given 03/11/23 1432)  dexamethasone (DECADRON) injection 10 mg (10 mg Intravenous Given 03/11/23 1137)  fentaNYL (SUBLIMAZE) injection 25 mcg (25 mcg Intravenous Given 03/11/23 1243)  ondansetron (ZOFRAN) injection 4 mg (4 mg Intravenous Given 03/11/23 1241)  iohexol (OMNIPAQUE) 300 MG/ML solution 75 mL (75 mLs Intravenous Contrast Given 03/11/23 1247)     IMPRESSION / MDM / ASSESSMENT AND PLAN / ED COURSE  I reviewed the triage vital signs and the nursing notes.   Patient's presentation is most consistent with acute presentation with potential threat to life or bodily function.   Differential includes abscess, parotitis, strep.  Given the significant swelling will get CT imaging to evaluate they deny any known allergic reactions anything to suggest allergic reaction.  Patient given some IV fentanyl, Zofran to help with symptoms  Patient's labs show elevated white count.  CMP shows a  reassuring creatinine strep test is negative.  Patient meets sepsis criteria therefore antibiotics were ordered  CT imaging shows significant swelling concerning for supraglottitis I discussed the case with the ENT who has evaluated patient with bedside scope given the concern for involvement of the airway.  However patient himself is overall well-appearing do not feel he patient need to be intubated at this time but he is going to continue to follow along and evaluate patient again in another hour.  In the meantime I have admitted patient to the hospitalist and the stepdown service so that patient can be monitored in the ICU given concern that patient may require airway later if his symptoms are worsening at all but at this time he feels much improved after Decadron, Unasyn.   FINAL CLINICAL IMPRESSION(S) / ED DIAGNOSES   Final diagnoses:  Infection of supraglottis (throat)     Rx / DC Orders   ED Discharge Orders     None        Note:  This document was prepared using Dragon voice recognition software and may include unintentional dictation errors.   Concha Se, MD 03/11/23 [id number]

## 2023-03-11 NOTE — Op Note (Signed)
....  03/11/2023  3:29 PM    Jatavian, Calica  [id number]   Pre-Op Dx:  Supraglottitis  Post-op Dx: same  Proc: Trans-nasal flexible laryngoscopy  Surg:  Bud Face  Anes:  GOT  EBL:  0  Comp:  none  Findings:  stable edema of base of tongue and piriform sinuses and posterior arytenoids.  Continued lack of edema on epiglottis and false cords and larynx.  Stable pooling of secretions.  Description of procedure.  After verbal consent was obtained the disposable distal chip laryngoscope was inserted into the patient's left nasal cavity without anesthesia due to concern for possible epiglottitis.  This was advanced for evaluation of the patient's nasopharynx, pharynx, larynx, hypopharynx.  This demonstrated moderate edema of base of tongue and effacement of bilateral piriform sinuses with pooling of secretions.  Mild arytenoid edema posteriorly.  Epiglottis and glottis without edema and vocal folds are mobile bilaterally.  Patient tolerated the procedure well.  Dispo:   Admit to Stepdown for observation  Plan:  Continue close monitoring.  No worsening of edema but no significant improvement either.  Patient continues to voice better.  Hospitalist beginning Rocephin and Vancomycin.  Recommend continuing Decadron 10mg  IV q6 x 24 hours and then decrease to q8 depending on improvement.  Bud Face  03/11/2023 3:29 PM

## 2023-03-11 NOTE — ED Notes (Signed)
Informed RN bed assigned 

## 2023-03-11 NOTE — ED Triage Notes (Signed)
Pt comes with c/o throat swelling. Pt states he noticed it yesterday. Pt states he feels like something is stuck in throat. Pt speaking in clean sentence. Pt does have noticeable swelling to neck and throat area. Pt states he can only take small sips of water. Pt voice does sound raspy.  Pt denies any know allergies

## 2023-03-11 NOTE — Consult Note (Signed)
William Osborne, Broad [id number] [birth year] William Duster, MD  Reason for Consult: supraglottitis  HPI: 68 year old male with several day history of neck swelling and progressive sore throat.  This morning his wife noted that patient's voice had changed and he has had problems trying to get a voice out.  He reports that he had a sore throat and difficulty swallowing as well.  He denies any previous history of this occurring.  No new medications.  He was febrile this morning up to 101.  He got Unasyn and Decadron prior to me seeing patient and he now can talk again.  Reports significant improvement in voice from earlier today.  He denies any current breathing difficulty but previously had shortness of breath due to swelling per patient.  Allergies: No Known Allergies  ROS: Review of systems normal other than 12 systems except per HPI.  PMH: History reviewed. No pertinent past medical history.  FH: History reviewed. No pertinent family history.  SH:  Social History   Socioeconomic History   Marital status: Married    Spouse name: Not on file   Number of children: Not on file   Years of education: Not on file   Highest education level: Not on file  Occupational History   Not on file  Tobacco Use   Smoking status: Every Day    Current packs/day: 0.25    Average packs/day: 0.3 packs/day for 50.0 years (12.5 ttl pk-yrs)    Types: Cigarettes   Smokeless tobacco: Never  Vaping Use   Vaping status: Never Used  Substance and Sexual Activity   Alcohol use: Yes    Comment: beer daily   Drug use: Never   Sexual activity: Not on file  Other Topics Concern   Not on file  Social History Narrative   Not on file   Social Drivers of Health   Financial Resource Strain: Not on file  Food Insecurity: Not on file  Transportation Needs: Not on file  Physical Activity: Not on file  Stress: Not on file  Social Connections: Not on file  Intimate Partner Violence: Not on file    PSH:   Past Surgical History:  Procedure Laterality Date   COLONOSCOPY     EYE SURGERY     XI ROBOTIC ASSISTED INGUINAL HERNIA REPAIR WITH MESH Right 11/26/2019   Procedure: XI ROBOTIC ASSISTED INGUINAL HERNIA REPAIR WITH MESH;  Surgeon: Campbell Lerner, MD;  Location: [facility] ORS;  Service: General;  Laterality: Right;    Physical  Exam:  GEN- supine in bad in NAD NEURO-  CN 2-12 grossly intact and symmetric EARS- external ears clear NOSE- septal deviation s shaped right worse than left OC/OP-  mallampati 3.  No significant uvular swelling NECK-  significant bilateral submandibular gland hypertrophy and firmness and tenderness to palpation.  Reactive lymph nodes. RESP-  no tripoding.  No stridor, no significant wet quality to voice CARD-  RRR  CT-  IMPRESSION: 1. Mucosal/submucosal edema within the supraglottic larynx. This is compatible with acute supraglottitis in the appropriate clinical setting. However, close clinical follow-up (with imaging follow-up as warranted) is recommended to ensure symptom resolution with treatment, and to exclude a mucosal/submucosal neoplasm at this site. Moderate airway effacement at this level. 2. Symmetric prominence of the submandibular glands (with surrounding edema/fat stranding) suspicious for acute submandibular gland sialoadenitis. 3. Small-volume secretions within the upper thoracic trachea. 4. Mild paranasal sinus mucosal thickening. 5. Developmental C2-C3 vertebral non-segmentation. 6. 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 7. Cervical spondylosis. 8. Aortic Atherosclerosis (ICD10-I70.0).   Procedure:  Trans-nasal flexible laryngoscopy.  Pre-procedure diagnosis:  supraglottitis, dysphonia.  Post-procedure diagnosis:  same.  Description of procedure.  After verbal consent was obtained the disposable distal chip laryngoscope was inserted into the patient's left nasal cavity without anesthesia due to concern  for possible epiglottitis.  This was advanced for evaluation of the patient's nasopharynx, pharynx, larynx, hypopharynx.  This demonstrated moderate edema of base of tongue and effacement of bilateral piriform sinuses with pooling of secretions.  Mild arytenoid edema posteriorly.  Epiglottis and glottis without edema and vocal folds are mobile bilaterally.   A/P: Supraglottitis improved with decadron/unasyn with patent airway and no invovlement of epiglottis/glottis.  Plan:  Discussed with patient and wife.  Earlier patient had a hard time speaking and breathing due to swelling and now reports significant improvement.  Discussed moderate amount of edema that is present and recommend close observation with serial laryngoscopies.  Will return in 1 hour for repeat laryngoscopy to make sure this is not worsening.  If stable or improved then most likely will re-scope later on this afternoon.  If symptoms worsen or swelling begins to involve epiglottis, will need intubation.   Bud Face 03/11/2023 3:19 PM

## 2023-03-11 NOTE — Op Note (Signed)
....  03/11/2023  5:37 PM    Deborah, Dondero  [id number]  Pre-Op Dx:  Supraglottitis   Post-op Dx: same   Proc: Trans-nasal flexible laryngoscopy   Surg:  Bud Face   Anes:  GOT   EBL:  0   Comp:  none   Findings: slight improved posterior edema of base of tongue and stable edema of piriform sinuses and slightly improved edema of posterior arytenoids with continued visible airway.  Continued lack of edema on epiglottis and false cords and larynx.  Stable pooling of secretions that patient is clearing   Description of procedure.  After verbal consent was obtained the disposable distal chip laryngoscope was inserted into the patient's left nasal cavity without anesthesia due to concern for possible epiglottitis.  This was advanced for evaluation of the patient's nasopharynx, pharynx, larynx, hypopharynx.  This demonstrated moderate edema of base of tongue but slightly improved and effacement of bilateral piriform sinuses with pooling of secretions.  Mild arytenoid edema posteriorly that has improved.  Epiglottis and glottis without edema and vocal folds are mobile bilaterally.   Patient tolerated the procedure well.   Dispo:   Admit to Stepdown for observation   Plan:  Continue close monitoring.  Slight improvement in edema.  OK for trial of some liquids but NPO if cannot tolerate.  Will plan on repeat laryngoscopy in a.m. as long as slow improvement continues.  Bud Face  03/11/2023 5:37 PM

## 2023-03-11 NOTE — H&P (Signed)
History and Physical    Patient: William Osborne ZOX:[id number] DOB: Mar 25, 1955 DOA: 03/11/2023 DOS: the patient was seen and examined on 03/11/2023 PCP: [facility]  Patient coming from: Home  Chief Complaint:  Chief Complaint  Patient presents with   Oral Swelling   HPI: William Osborne is a 68 y.o. male with medical history significant for hypertension, hyperlipidemia presented to the emergency department for evaluation of neck discomfort, swelling, trouble breathing.  Patient states symptoms started 2 days ago.  He has difficulty speaking, breathing which did worse presented to the emergency department for further management.  Patient's wife reported that he had a fever of 101 this morning.  No nausea vomiting or abdominal discomfort.  No headache, blurred vision.  Denies any sick contacts.  No cough runny nose or sore throat.  No recent viral illness.    Upon arrival to the emergency department, patient was hemodynamically stable, CT soft tissue neck showed mucosal and submucosal edema, findings of acute supraglottitis.  ENT evaluation called by EDP.  Patient got IV steroids, IV antibiotics, TRH consulted for stepdown admission for close monitoring of airway.   Review of Systems: As mentioned in the history of present illness. All other systems reviewed and are negative. History reviewed. No pertinent past medical history. Past Surgical History:  Procedure Laterality Date   COLONOSCOPY     EYE SURGERY     XI ROBOTIC ASSISTED INGUINAL HERNIA REPAIR WITH MESH Right 11/26/2019   Procedure: XI ROBOTIC ASSISTED INGUINAL HERNIA REPAIR WITH MESH;  Surgeon: Campbell Lerner, MD;  Location: [facility] ORS;  Service: General;  Laterality: Right;   Social History:  reports that he has been smoking cigarettes. He has a 12.5 pack-year smoking history. He has never used smokeless tobacco. He reports current alcohol use. He reports that he does not use drugs.  No  Known Allergies  History reviewed. No pertinent family history.  Prior to Admission medications   Medication Sig Start Date End Date Taking? Authorizing Provider  etodolac (LODINE) 400 MG tablet Take 1 tablet (400 mg total) by mouth 2 (two) times daily. 04/27/22   Tommi Rumps, PA-C    Physical Exam: Vitals:   03/11/23 1300 03/11/23 1330 03/11/23 1434 03/11/23 1434  BP: 130/63 (!) 131/59 136/64   Pulse: 85 78 74   Resp:   (!) 22   Temp:   98.6 F (37 C)   TempSrc:   Oral   SpO2: 100% 100% 95% 95%  Weight:      Height:       General - Elderly Hispanic male, mild respiratory distress HEENT - PERRLA, EOMI, throat swelling, tender. Lung - Clear, rales, rhonchi, wheezes. Heart - S1, S2 heard, no murmurs, rubs, trace pedal edema. Abdomen- soft, non tender, non distended. Neuro - Alert, awake and oriented x 3, non focal exam. Skin - Warm and dry. Data Reviewed:     Latest Ref Rng & Units 03/11/2023   11:31 AM 11/22/2019   12:09 PM 09/09/2019    6:42 AM  CBC  WBC 4.0 - 10.5 K/uL 16.2  5.9  7.8   Hemoglobin 13.0 - 17.0 g/dL 81.1  91.4  78.2   Hematocrit 39.0 - 52.0 % 40.8  40.2  42.5   Platelets 150 - 400 K/uL 242  292  352       Latest Ref Rng & Units 03/11/2023   11:31 AM 11/22/2019   12:09 PM 09/09/2019    6:42 AM  BMP  Glucose 70 - 99 mg/dL 956  213  086   BUN 8 - 23 mg/dL 18  15  14    Creatinine 0.61 - 1.24 mg/dL 5.78  4.69  6.29   Sodium 135 - 145 mmol/L 133  134  134   Potassium 3.5 - 5.1 mmol/L 3.6  4.1  4.3   Chloride 98 - 111 mmol/L 99  99  97   CO2 22 - 32 mmol/L 23  25  25    Calcium 8.9 - 10.3 mg/dL 9.4  9.9  9.8     CT Soft Tissue Neck W Contrast Result Date: 03/11/2023 CLINICAL DATA:  Provided history: Epiglottitis or tonsillitis suspected. Additional history provided: The patient reports throat swelling, feeling as though something is stuck in throat. Visible neck swelling. EXAM: CT NECK WITH CONTRAST TECHNIQUE: Multidetector CT imaging of the neck was  performed using the standard protocol following the bolus administration of intravenous contrast. RADIATION DOSE REDUCTION: This exam was performed according to the departmental dose-optimization program which includes automated exposure control, adjustment of the mA and/or kV according to patient size and/or use of iterative reconstruction technique. CONTRAST:  75mL OMNIPAQUE IOHEXOL 300 MG/ML  SOLN COMPARISON:  None. FINDINGS: Pharynx and larynx: Mucosal/submucosal edema within the supraglottic larynx, left greater than right (for instance as seen on series 2, image 73). Moderate airway effacement at this level. No appreciable swelling or mass within the oral cavity or pharynx. No retropharyngeal collection. Salivary glands: Symmetric prominence of the submandibular glands with surrounding edema/fat stranding. Unremarkable appearance of the bilateral submandibular glands. No obstructing salivary stone is identified. Thyroid: Unremarkable. Lymph nodes: No pathologically enlarged lymph nodes identified. Vascular: The major vascular structures of the neck are patent. Atherosclerotic plaque within the visualized thoracic aorta, proximal major branch vessels of the neck and carotid arteries. Limited intracranial: No evidence of an acute intracranial abnormality within the field of view. Visualized orbits: No orbital mass or acute orbital finding. Mastoids and visualized paranasal sinuses: Mild mucosal thickening within the left frontal, bilateral ethmoid, bilateral sphenoid and bilateral maxillary sinuses. No significant mastoid effusion. 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 Grade 1 anterolisthesis at C4-C5, C5-C6, C6-C7 and C7-T1. Levocurvature of the cervical spine. Cervical spondylosis. The patient is edentulous. No acute fracture or aggressive osseous lesion. Upper chest: No consolidation within the imaged lung apices. Biapical pleuroparenchymal scarring. Small-volume secretions within the  upper thoracic trachea (series 6, image for instance as seen on series 6, image 121). Other: Edema within the bilateral perimandibular/upper neck soft tissues (for instance as seen on series 2, image 85). IMPRESSION: 1. Mucosal/submucosal edema within the supraglottic larynx. This is compatible with acute supraglottitis in the appropriate clinical setting. However, close clinical follow-up (with imaging follow-up as warranted) is recommended to ensure symptom resolution with treatment, and to exclude a mucosal/submucosal neoplasm at this site. Moderate airway effacement at this level. 2. Symmetric prominence of the submandibular glands (with surrounding edema/fat stranding) suspicious for acute submandibular gland sialoadenitis. 3. Small-volume secretions within the upper thoracic trachea. 4. Mild paranasal sinus mucosal thickening. 5. Developmental C2-C3 vertebral non-segmentation. 6. Levocurvature of the cervical spine and multilevel grade 1 spondylolisthesis. 7. Cervical spondylosis. 8. Aortic Atherosclerosis (ICD10-I70.0). Electronically Signed   By: Jackey Loge D.O.   On: 03/11/2023 13:28   Assessment and Plan: Keldric Poyer is a 68 y.o. male with medical history significant for hypertension, hyperlipidemia presented to the emergency department for evaluation of neck discomfort, swelling, trouble breathing since 2 days. CT  soft tissue neck showed evidence of acute supraglottitis.  Plan: Acute supraglottitis - CT soft tissue neck - Mucosal/submucosal edema within the supraglottic larynx findings compatible with acute supraglottitis. Reported fever, white count 16. Etiology possibly bacterial.  Strep throat negative.  Flu, COVID-negative. Respiratory 20 pathogen panel ordered. Follow-up blood cultures. Patient got Unasyn therapy in ED.  Will start him on vancomycin and Rocephin therapy. Continue Decadron 10 mg Q6 hourly. Close monitoring of his airway in stepdown unit. Discussed with  ENT.  Hyponatremia: Seems chronic. Continue to trend.  Hypertension- IV Hydralazine as needed. He is not on antihypertensives at home.  Continue nursing supportive care. Fall, aspiration precautions. DVT prophylaxis Lovenox.   Advance Care Planning:   Code Status: Full Code discussed with patient, wife at bedside.  Consults: ENT  Family Communication: Discussed current care plan with patient and wife at bedside.  Severity of Illness: The appropriate patient status for this patient is INPATIENT. Inpatient status is judged to be reasonable and necessary in order to provide the required intensity of service to ensure the patient's safety. The patient's presenting symptoms, physical exam findings, and initial radiographic and laboratory data in the context of their chronic comorbidities is felt to place them at high risk for further clinical deterioration. Furthermore, it is not anticipated that the patient will be medically stable for discharge from the hospital within 2 midnights of admission.   * I certify that at the point of admission it is my clinical judgment that the patient will require inpatient hospital care spanning beyond 2 midnights from the point of admission due to high intensity of service, high risk for further deterioration and high frequency of surveillance required.*  Author: Marcelino Duster, MD 03/11/2023 2:51 PM  For on call review [email or web address].

## 2023-03-12 DIAGNOSIS — J0430 Supraglottitis, unspecified, without obstruction: Secondary | ICD-10-CM | POA: Diagnosis not present

## 2023-03-12 DIAGNOSIS — E871 Hypo-osmolality and hyponatremia: Secondary | ICD-10-CM | POA: Diagnosis not present

## 2023-03-12 DIAGNOSIS — I10 Essential (primary) hypertension: Secondary | ICD-10-CM | POA: Diagnosis not present

## 2023-03-12 DIAGNOSIS — E782 Mixed hyperlipidemia: Secondary | ICD-10-CM

## 2023-03-12 LAB — CBC
HCT: 43.5 % (ref 39.0–52.0)
Hemoglobin: 14.2 g/dL (ref 13.0–17.0)
MCH: 26.4 pg (ref 26.0–34.0)
MCHC: 32.6 g/dL (ref 30.0–36.0)
MCV: 81 fL (ref 80.0–100.0)
Platelets: 259 10*3/uL (ref 150–400)
RBC: 5.37 MIL/uL (ref 4.22–5.81)
RDW: 14.2 % (ref 11.5–15.5)
WBC: 18.5 10*3/uL — ABNORMAL HIGH (ref 4.0–10.5)
nRBC: 0 % (ref 0.0–0.2)

## 2023-03-12 LAB — BASIC METABOLIC PANEL
Anion gap: 12 (ref 5–15)
BUN: 29 mg/dL — ABNORMAL HIGH (ref 8–23)
CO2: 21 mmol/L — ABNORMAL LOW (ref 22–32)
Calcium: 9.2 mg/dL (ref 8.9–10.3)
Chloride: 101 mmol/L (ref 98–111)
Creatinine, Ser: 1.18 mg/dL (ref 0.61–1.24)
GFR, Estimated: >60 mL/min (ref >60)
Glucose, Bld: 134 mg/dL — ABNORMAL HIGH (ref 70–99)
Potassium: 3.9 mmol/L (ref 3.5–5.1)
Sodium: 134 mmol/L — ABNORMAL LOW (ref 135–145)

## 2023-03-12 LAB — HIV ANTIBODY (ROUTINE TESTING W REFLEX): HIV Screen 4th Generation wRfx: NONREACTIVE

## 2023-03-12 MED ORDER — DEXAMETHASONE SODIUM PHOSPHATE 10 MG/ML IJ SOLN
10.0000 mg | Freq: Three times a day (TID) | INTRAMUSCULAR | Status: DC
  Administered 2023-03-12 – 2023-03-13 (×3): 10 mg via INTRAVENOUS
  Filled 2023-03-12 (×4): qty 1

## 2023-03-12 MED ORDER — ORAL CARE MOUTH RINSE: 15.0000 mL | OROMUCOSAL | Status: DC | PRN

## 2023-03-12 NOTE — Plan of Care (Signed)

## 2023-03-12 NOTE — Progress Notes (Signed)
 Progress Note   Patient: William Osborne JXB:[id number] DOB: 1955/07/10 DOA: 03/11/2023     1 DOS: the patient was seen and examined on 03/12/2023   Brief hospital course: Elfego Giammarino is a 68 y.o. male with medical history significant for hypertension, hyperlipidemia presented to the emergency department for evaluation of neck discomfort, swelling, trouble breathing, speaking.    CT soft tissue neck showed mucosal and submucosal edema, findings of acute supraglottitis.  ENT evaluated him, performed flex laryngoscopy x2 with improvement in edema. Advised steroids, antibiotics.  Assessment and Plan: Acute supraglottitis - Reported fever, white count 16. Reviewed CT soft tissue. Etiology possibly bacterial.  Strep throat negative.  Flu, COVID-negative. Respiratory 20 pathogen panel negative Follow-up blood cultures. ENT follow up appreciated. S/p flex laryngoscopy x3. His edema improving. Continue vancomycin and Rocephin therapy. Decadron 10 mg changed to q8 hourly. Close monitoring of his airway in stepdown unit. Clear liquid diet advanced to full liquids. Discussed with ENT.   Hyponatremia: Seems chronic. Continue to trend.   Hypertension- IV Hydralazine as needed. Losartan held.      Out of bed to chair. Incentive spirometry. Nursing supportive care. Fall, aspiration precautions. DVT prophylaxis   Code Status: Full Code  Subjective: Patient is seen and examined today morning. He feels better. No shortness of breath, able to tolerate clears.   Physical Exam: Vitals:   03/12/23 0600 03/12/23 0700 03/12/23 0800 03/12/23 0900  BP: 111/63 (!) 97/56 (!) 105/59 114/67  Pulse: (!) 57 (!) 56 62 74  Resp: 13 12 15 14   Temp:   98 F (36.7 C)   TempSrc:   Oral   SpO2: 96% 92% 96% 95%  Weight:      Height:        General - Elderly Hispanic male, no apparent distress HEENT - PERRLA, EOMI, throat swelling better, non tender sinuses. Lung - Clear, no  rales, rhonchi, wheezes. Heart - S1, S2 heard, no murmurs, rubs, no pedal edema. Abdomen - Soft, non tender, bowel sounds good Neuro - Alert, awake and oriented x 3, non focal exam. Skin - Warm and dry.  Data Reviewed:      Latest Ref Rng & Units 03/12/2023    4:24 AM 03/11/2023   11:31 AM 11/22/2019   12:09 PM  CBC  WBC 4.0 - 10.5 K/uL 18.5  16.2  5.9   Hemoglobin 13.0 - 17.0 g/dL 13.0  86.5  78.4   Hematocrit 39.0 - 52.0 % 43.5  40.8  40.2   Platelets 150 - 400 K/uL 259  242  292       Latest Ref Rng & Units 03/12/2023    4:24 AM 03/11/2023   11:31 AM 11/22/2019   12:09 PM  BMP  Glucose 70 - 99 mg/dL 696  295  284   BUN 8 - 23 mg/dL 29  18  15    Creatinine 0.61 - 1.24 mg/dL 1.32  4.40  1.02   Sodium 135 - 145 mmol/L 134  133  134   Potassium 3.5 - 5.1 mmol/L 3.9  3.6  4.1   Chloride 98 - 111 mmol/L 101  99  99   CO2 22 - 32 mmol/L 21  23  25    Calcium 8.9 - 10.3 mg/dL 9.2  9.4  9.9    CT Soft Tissue Neck W Contrast Result Date: 03/11/2023 CLINICAL DATA:  Provided history: Epiglottitis or tonsillitis suspected. Additional history provided: The patient reports throat swelling, feeling as though something is  stuck in throat. Visible neck swelling. EXAM: CT NECK WITH CONTRAST TECHNIQUE: Multidetector CT imaging of the neck was performed using the standard protocol following the bolus administration of intravenous contrast. RADIATION DOSE REDUCTION: This exam was performed according to the departmental dose-optimization program which includes automated exposure control, adjustment of the mA and/or kV according to patient size and/or use of iterative reconstruction technique. CONTRAST:  75mL OMNIPAQUE IOHEXOL 300 MG/ML  SOLN COMPARISON:  None. FINDINGS: Pharynx and larynx: Mucosal/submucosal edema within the supraglottic larynx, left greater than right (for instance as seen on series 2, image 73). Moderate airway effacement at this level. No appreciable swelling or mass within the oral  cavity or pharynx. No retropharyngeal collection. Salivary glands: Symmetric prominence of the submandibular glands with surrounding edema/fat stranding. Unremarkable appearance of the bilateral submandibular glands. No obstructing salivary stone is identified. Thyroid: Unremarkable. Lymph nodes: No pathologically enlarged lymph nodes identified. Vascular: The major vascular structures of the neck are patent. Atherosclerotic plaque within the visualized thoracic aorta, proximal major branch vessels of the neck and carotid arteries. Limited intracranial: No evidence of an acute intracranial abnormality within the field of view. Visualized orbits: No orbital mass or acute orbital finding. Mastoids and visualized paranasal sinuses: Mild mucosal thickening within the left frontal, bilateral ethmoid, bilateral sphenoid and bilateral maxillary sinuses. No significant mastoid effusion. 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 Grade 1 anterolisthesis at C4-C5, C5-C6, C6-C7 and C7-T1. Levocurvature of the cervical spine. Cervical spondylosis. The patient is edentulous. No acute fracture or aggressive osseous lesion. Upper chest: No consolidation within the imaged lung apices. Biapical pleuroparenchymal scarring. Small-volume secretions within the upper thoracic trachea (series 6, image for instance as seen on series 6, image 121). Other: Edema within the bilateral perimandibular/upper neck soft tissues (for instance as seen on series 2, image 85). IMPRESSION: 1. Mucosal/submucosal edema within the supraglottic larynx. This is compatible with acute supraglottitis in the appropriate clinical setting. However, close clinical follow-up (with imaging follow-up as warranted) is recommended to ensure symptom resolution with treatment, and to exclude a mucosal/submucosal neoplasm at this site. Moderate airway effacement at this level. 2. Symmetric prominence of the submandibular glands (with surrounding  edema/fat stranding) suspicious for acute submandibular gland sialoadenitis. 3. Small-volume secretions within the upper thoracic trachea. 4. Mild paranasal sinus mucosal thickening. 5. Developmental C2-C3 vertebral non-segmentation. 6. Levocurvature of the cervical spine and multilevel grade 1 spondylolisthesis. 7. Cervical spondylosis. 8. Aortic Atherosclerosis (ICD10-I70.0). Electronically Signed   By: Jackey Loge D.O.   On: 03/11/2023 13:28   Family Communication: Discussed with family at bedside, they understand and agree. All questions answereed.  Disposition: Status is: Inpatient Remains inpatient appropriate because: air way management with Supraglottitis   Planned Discharge Destination: Home     Time spent: 41 minutes  Author: Marcelino Duster, MD 03/12/2023 9:53 AM Secure chat 7am to 7pm For on call review [email or web address].

## 2023-03-12 NOTE — Plan of Care (Signed)
  Problem: Clinical Measurements: Goal: Will remain free from infection Outcome: Progressing Goal: Diagnostic test results will improve Outcome: Progressing Goal: Cardiovascular complication will be avoided Outcome: Progressing   Problem: Safety: Goal: Ability to remain free from injury will improve Outcome: Progressing   Problem: Skin Integrity: Goal: Risk for impaired skin integrity will decrease Outcome: Progressing

## 2023-03-12 NOTE — Progress Notes (Signed)
 ..03/12/2023 9:53 AM  William Osborne [id number]    Temp:  [97.7 F (36.5 C)-98.6 F (37 C)] 98 F (36.7 C) (02/22 0800) Pulse Rate:  [50-97] 74 (02/22 0900) Resp:  [12-24] 14 (02/22 0900) BP: (97-136)/(56-94) 114/67 (02/22 0900) SpO2:  [92 %-100 %] 95 % (02/22 0900) Weight:  [63.5 kg-69.1 kg] 69.1 kg (02/21 1829),     Intake/Output Summary (Last 24 hours) at 03/12/2023 0953 Last data filed at 03/12/2023 0500 Gross per 24 hour  Intake 498.73 ml  Output 500 ml  Net -1.27 ml    Results for orders placed or performed during the hospital encounter of 03/11/23 (from the past 24 hours)  CBC with Differential     Status: Abnormal   Collection Time: 03/11/23 11:31 AM  Result Value Ref Range   WBC 16.2 (H) 4.0 - 10.5 K/uL   RBC 5.09 4.22 - 5.81 MIL/uL   Hemoglobin 13.6 13.0 - 17.0 g/dL   HCT 86.5 78.4 - 69.6 %   MCV 80.2 80.0 - 100.0 fL   MCH 26.7 26.0 - 34.0 pg   MCHC 33.3 30.0 - 36.0 g/dL   RDW 29.5 28.4 - 13.2 %   Platelets 242 150 - 400 K/uL   nRBC 0.0 0.0 - 0.2 %   Neutrophils Relative % 77 %   Neutro Abs 12.7 (H) 1.7 - 7.7 K/uL   Lymphocytes Relative 11 %   Lymphs Abs 1.7 0.7 - 4.0 K/uL   Monocytes Relative 10 %   Monocytes Absolute 1.6 (H) 0.1 - 1.0 K/uL   Eosinophils Relative 0 %   Eosinophils Absolute 0.0 0.0 - 0.5 K/uL   Basophils Relative 1 %   Basophils Absolute 0.1 0.0 - 0.1 K/uL   Immature Granulocytes 1 %   Abs Immature Granulocytes 0.11 (H) 0.00 - 0.07 K/uL  Comprehensive metabolic panel     Status: Abnormal   Collection Time: 03/11/23 11:31 AM  Result Value Ref Range   Sodium 133 (L) 135 - 145 mmol/L   Potassium 3.6 3.5 - 5.1 mmol/L   Chloride 99 98 - 111 mmol/L   CO2 23 22 - 32 mmol/L   Glucose, Bld 108 (H) 70 - 99 mg/dL   BUN 18 8 - 23 mg/dL   Creatinine, Ser 4.40 0.61 - 1.24 mg/dL   Calcium 9.4 8.9 - 10.2 mg/dL   Total Protein 7.8 6.5 - 8.1 g/dL   Albumin 3.8 3.5 - 5.0 g/dL   AST 23 15 - 41 U/L   ALT 21 0 - 44 U/L   Alkaline Phosphatase 56  38 - 126 U/L   Total Bilirubin 1.1 0.0 - 1.2 mg/dL   GFR, Estimated >72 >53 mL/min   Anion gap 11 5 - 15  Group A Strep by PCR ([facility] Only)     Status: None   Collection Time: 03/11/23 11:31 AM   Specimen: Throat; Sterile Swab  Result Value Ref Range   Group A Strep by PCR NOT DETECTED NOT DETECTED  Resp panel by RT-PCR (RSV, Flu A&B, Covid) Throat     Status: None   Collection Time: 03/11/23 11:31 AM   Specimen: Throat; Nasal Swab  Result Value Ref Range   SARS Coronavirus 2 by RT PCR NEGATIVE NEGATIVE   Influenza A by PCR NEGATIVE NEGATIVE   Influenza B by PCR NEGATIVE NEGATIVE   Resp Syncytial Virus by PCR NEGATIVE NEGATIVE  Mononucleosis screen     Status: None   Collection Time: 03/11/23 11:31 AM  Result  Value Ref Range   Mono Screen NEGATIVE NEGATIVE  Lactic acid, plasma     Status: None   Collection Time: 03/11/23  1:55 PM  Result Value Ref Range   Lactic Acid, Venous 1.4 0.5 - 1.9 mmol/L  Blood culture (routine x 2)     Status: None (Preliminary result)   Collection Time: 03/11/23  1:56 PM   Specimen: BLOOD  Result Value Ref Range   Specimen Description BLOOD BLOOD LEFT WRIST    Special Requests      BOTTLES DRAWN AEROBIC ONLY Blood Culture results may not be optimal due to an inadequate volume of blood received in culture bottles   Culture      NO GROWTH < 24 HOURS Performed at [facility], [address] [place], [place] [address]    Report Status PENDING   Blood culture (routine x 2)     Status: None (Preliminary result)   Collection Time: 03/11/23  2:10 PM   Specimen: BLOOD  Result Value Ref Range   Specimen Description BLOOD BLOOD RIGHT FOREARM    Special Requests      BOTTLES DRAWN AEROBIC ONLY Blood Culture results may not be optimal due to an inadequate volume of blood received in culture bottles   Culture      NO GROWTH < 24 HOURS Performed at [facility], [address] [place], [place] [address]    Report Status PENDING    Lactic acid, plasma     Status: None   Collection Time: 03/11/23  5:17 PM  Result Value Ref Range   Lactic Acid, Venous 1.6 0.5 - 1.9 mmol/L  Respiratory (~20 pathogens) panel by PCR     Status: None   Collection Time: 03/11/23  5:17 PM   Specimen: Nasopharyngeal Swab; Respiratory  Result Value Ref Range   Adenovirus NOT DETECTED NOT DETECTED   Coronavirus 229E NOT DETECTED NOT DETECTED   Coronavirus HKU1 NOT DETECTED NOT DETECTED   Coronavirus NL63 NOT DETECTED NOT DETECTED   Coronavirus OC43 NOT DETECTED NOT DETECTED   Metapneumovirus NOT DETECTED NOT DETECTED   Rhinovirus / Enterovirus NOT DETECTED NOT DETECTED   Influenza A NOT DETECTED NOT DETECTED   Influenza B NOT DETECTED NOT DETECTED   Parainfluenza Virus 1 NOT DETECTED NOT DETECTED   Parainfluenza Virus 2 NOT DETECTED NOT DETECTED   Parainfluenza Virus 3 NOT DETECTED NOT DETECTED   Parainfluenza Virus 4 NOT DETECTED NOT DETECTED   Respiratory Syncytial Virus NOT DETECTED NOT DETECTED   Bordetella pertussis NOT DETECTED NOT DETECTED   Bordetella Parapertussis NOT DETECTED NOT DETECTED   Chlamydophila pneumoniae NOT DETECTED NOT DETECTED   Mycoplasma pneumoniae NOT DETECTED NOT DETECTED  MRSA Next Gen by PCR, Nasal     Status: None   Collection Time: 03/11/23  6:29 PM   Specimen: Nasal Mucosa; Nasal Swab  Result Value Ref Range   MRSA by PCR Next Gen NOT DETECTED NOT DETECTED  Glucose, capillary     Status: Abnormal   Collection Time: 03/11/23  6:35 PM  Result Value Ref Range   Glucose-Capillary 155 (H) 70 - 99 mg/dL  Basic metabolic panel     Status: Abnormal   Collection Time: 03/12/23  4:24 AM  Result Value Ref Range   Sodium 134 (L) 135 - 145 mmol/L   Potassium 3.9 3.5 - 5.1 mmol/L   Chloride 101 98 - 111 mmol/L   CO2 21 (L) 22 - 32 mmol/L   Glucose, Bld 134 (H)  70 - 99 mg/dL   BUN 29 (H) 8 - 23 mg/dL   Creatinine, Ser 8.11 0.61 - 1.24 mg/dL   Calcium 9.2 8.9 - 91.4 mg/dL   GFR, Estimated >78 >29 mL/min    Anion gap 12 5 - 15  CBC     Status: Abnormal   Collection Time: 03/12/23  4:24 AM  Result Value Ref Range   WBC 18.5 (H) 4.0 - 10.5 K/uL   RBC 5.37 4.22 - 5.81 MIL/uL   Hemoglobin 14.2 13.0 - 17.0 g/dL   HCT 56.2 13.0 - 86.5 %   MCV 81.0 80.0 - 100.0 fL   MCH 26.4 26.0 - 34.0 pg   MCHC 32.6 30.0 - 36.0 g/dL   RDW 78.4 69.6 - 29.5 %   Platelets 259 150 - 400 K/uL   nRBC 0.0 0.0 - 0.2 %    SUBJECTIVE:  No acute events.  Tolerated PO liquids intermittently.  Reports no issues with breathing.  Feels can swallow better and talk better.  OBJECTIVE:  GEN-  NAD, supine in bed OC/OP-  no masses or lesions NECK-  improves submandibular hypertrophy and firmness.  Right has improved more than left.  Decreased tenderness, no fluctuance   Trans-nasal flexible laryngoscopy:  Please see separate operative note for details but improved overall exam with improved airway  IMPRESSION:  Supraglottitis improving  PLAN:  Patient has improved and more visible airway/epiglottis today.  Still had moderate amount of edema in supraglottis.  Recommend observation in stepdown if possible over course of today and if continues to improve at current rate then floor status tomorrow.  Will plan on rescope tomorrow morning.  OK to advance diet to full liquid and then soft mechanical if does well.  Recommend changing decadron to 10 q 8  [employer] 03/12/2023, 9:53 AM

## 2023-03-12 NOTE — Op Note (Addendum)
....  03/12/2023  9:57 AM    Jones Skene  [id number]  Pre-Op Dx:  Supraglottitis   Post-op Dx: same   Proc: Trans-nasal flexible laryngoscopy   Surg:  Bud Face   Anes:  GOT   EBL:  0   Comp:  none   Findings: moderately improved edema of piriform sinuses and and lingual tonsils with more visible epiglottis on lingual surface.  Continued lack of edema of epiglottis/vocal folds/false vocal folds.  Improved arytenoid edema as well.  Widely visible airway.  Resolution of pooling of secretions.   Description of procedure.  After verbal consent was obtained the disposable distal chip laryngoscope was inserted into the patient's left nasal cavity without anesthesia due to concern for possible epiglottitis.  This was advanced for evaluation of the patient's nasopharynx, pharynx, larynx, hypopharynx.  This showed  moderately improved edema of piriform sinuses and and lingual tonsils with more visible epiglottis on lingual surface.  Continued lack of edema of epiglottis/vocal folds/false vocal folds.  Improved arytenoid edema as well.  Widely visible airway.  Resolution of pooling of secretions.  Patient tolerated the procedure well.   Dispo:   Given continued moderate swelling recommend continued observation in Stepdown   Plan:  Continue close monitoring.  OK to advance diet to full liquid and then soft mechanical later today.  Follow cultures.  All negative so far.   Bud Face  03/12/2023 9:57 AM

## 2023-03-13 DIAGNOSIS — I10 Essential (primary) hypertension: Secondary | ICD-10-CM | POA: Diagnosis not present

## 2023-03-13 DIAGNOSIS — J0430 Supraglottitis, unspecified, without obstruction: Secondary | ICD-10-CM | POA: Diagnosis not present

## 2023-03-13 DIAGNOSIS — E871 Hypo-osmolality and hyponatremia: Secondary | ICD-10-CM | POA: Diagnosis not present

## 2023-03-13 DIAGNOSIS — E782 Mixed hyperlipidemia: Secondary | ICD-10-CM | POA: Diagnosis not present

## 2023-03-13 MED ORDER — DEXAMETHASONE SODIUM PHOSPHATE 10 MG/ML IJ SOLN
10.0000 mg | Freq: Two times a day (BID) | INTRAMUSCULAR | Status: DC
  Administered 2023-03-13 – 2023-03-14 (×2): 10 mg via INTRAVENOUS
  Filled 2023-03-13 (×2): qty 1

## 2023-03-13 NOTE — TOC CM/SW Note (Signed)
 Transition of Care [facility]) - Inpatient Brief Assessment   Patient Details  Name: William Osborne MRN: [id number] Date of Birth: [birth year]  Transition of Care [facility]) CM/SW Contact:    Liliana Cline, LCSW Phone Number: 03/13/2023, 8:49 AM   Clinical Narrative:    Transition of Care Asessment: Insurance and Status: Insurance coverage has been reviewed Patient has primary care physician: Yes     Prior/Current Home Services: No current home services Social Drivers of Health Review: SDOH reviewed no interventions necessary Readmission risk has been reviewed: Yes Transition of care needs: no transition of care needs at this time

## 2023-03-13 NOTE — Op Note (Signed)
....  03/13/2023    William Osborne, William Osborne  [id number]   Pre-Op Dx:  Infection of supraglottis (throat) [J04.30] Adult supraglottitis [J04.30]  Post-op Dx: Infection of supraglottis (throat) [J04.30] Adult supraglottitis [J04.30]   Proc: Trans-nasal flexible laryngoscopy   Surg:  Roney Mans William Osborne   Anes:  GOT   EBL:  0   Comp:  none   Findings:  Significant improvement in piriform sinus edema with near normal left piriform sinus and only mild edema left on right. No longer any pooling of secretions. Base of tongue improved as well. TVF mobile and larynx easily visible.    Description of procedure.  After verbal consent was obtained the disposable distal chip laryngoscope was inserted into the patient's left nasal cavity without anesthesia due to concern for possible epiglottitis.  This was advanced for evaluation of the patient's nasopharynx, pharynx, larynx, hypopharynx.  This showed significant improvement in piriform sinus edema with near normal left piriform sinus and only mild edema left on right. No longer any pooling of secretions. Base of tongue improved as well. TVF mobile and larynx easily visible.  Patient tolerated the procedure well.   Dispo:  Widely patent airway.  Cleared for floor status and discussed with Hospitalist.   Plan:  Advance diet and wean Decadron.  Anticipate discharge tomorrow if continues to improve.  Roney Mans William Osborne 8:15 PM 03/13/2023

## 2023-03-13 NOTE — Plan of Care (Signed)
  Problem: Education: Goal: Knowledge of General Education information will improve Description: Including pain rating scale, medication(s)/side effects and non-pharmacologic comfort measures Outcome: Progressing   Problem: Clinical Measurements: Goal: Ability to maintain clinical measurements within normal limits will improve Outcome: Progressing Goal: Will remain free from infection Outcome: Progressing Goal: Diagnostic test results will improve Outcome: Progressing   Problem: Safety: Goal: Ability to remain free from injury will improve Outcome: Progressing   Problem: Skin Integrity: Goal: Risk for impaired skin integrity will decrease Outcome: Progressing

## 2023-03-13 NOTE — Progress Notes (Signed)
 Progress Note   Patient: William Osborne ZOX:[id number] DOB: December 31, 1955 DOA: 03/11/2023     2 DOS: the patient was seen and examined on 03/13/2023   Brief hospital course: Theo Krumholz is a 68 y.o. male with medical history significant for hypertension, hyperlipidemia presented to the emergency department for evaluation of neck discomfort, swelling, trouble breathing, speaking.    CT soft tissue neck showed mucosal and submucosal edema, findings of acute supraglottitis.  ENT evaluated him, performed flex laryngoscopy x3 with improvement in edema. Advised steroids, antibiotics.  Assessment and Plan: Acute supraglottitis - Reported fever, white count 16. Reviewed CT soft tissue. Etiology possibly bacterial.  Strep throat negative.  Flu, COVID-negative. Respiratory 20 pathogen panel negative Follow-up blood cultures. S/p flex laryngoscopy x3. His edema improving. Advised only Rocephin therapy. Decadron 10 mg changed to q12 hourly. Transfer to med/ surg floor. Diet advanced to soft. Discussed with ENT ? Mumps as a cause. Will get Ig G, M order placed.   Hyponatremia: Seems chronic. Continue to trend.   Hypertension- BP stable. He is on Losartan and Amlodipine which are held.      Out of bed to chair. Incentive spirometry. Nursing supportive care. Fall, aspiration precautions. DVT prophylaxis   Code Status: Full Code  Subjective: Patient is seen and examined today morning. He feels improved everyday, able to tolerate liquids well, speech better. Marland Kitchen   Physical Exam: Vitals:   03/13/23 0600 03/13/23 0700 03/13/23 0800 03/13/23 0900  BP: 107/61 (!) 111/55 102/63 (!) 110/53  Pulse: (!) 54 80 60 60  Resp: 12 13 (!) 21 18  Temp:      TempSrc:      SpO2: 95% 95% 95% 93%  Weight:      Height:        General - Elderly Hispanic male, no apparent distress HEENT - PERRLA, EOMI, throat swelling better, non tender sinuses. Lung - Clear, no rales, rhonchi,  wheezes. Heart - S1, S2 heard, no murmurs, rubs, no pedal edema. Abdomen - Soft, non tender, bowel sounds good Neuro - Alert, awake and oriented x 3, non focal exam. Skin - Warm and dry.  Data Reviewed:      Latest Ref Rng & Units 03/12/2023    4:24 AM 03/11/2023   11:31 AM 11/22/2019   12:09 PM  CBC  WBC 4.0 - 10.5 K/uL 18.5  16.2  5.9   Hemoglobin 13.0 - 17.0 g/dL 81.1  91.4  78.2   Hematocrit 39.0 - 52.0 % 43.5  40.8  40.2   Platelets 150 - 400 K/uL 259  242  292       Latest Ref Rng & Units 03/12/2023    4:24 AM 03/11/2023   11:31 AM 11/22/2019   12:09 PM  BMP  Glucose 70 - 99 mg/dL 956  213  086   BUN 8 - 23 mg/dL 29  18  15    Creatinine 0.61 - 1.24 mg/dL 5.78  4.69  6.29   Sodium 135 - 145 mmol/L 134  133  134   Potassium 3.5 - 5.1 mmol/L 3.9  3.6  4.1   Chloride 98 - 111 mmol/L 101  99  99   CO2 22 - 32 mmol/L 21  23  25    Calcium 8.9 - 10.3 mg/dL 9.2  9.4  9.9    CT Soft Tissue Neck W Contrast Result Date: 03/11/2023 CLINICAL DATA:  Provided history: Epiglottitis or tonsillitis suspected. Additional history provided: The patient reports throat swelling, feeling  as though something is stuck in throat. Visible neck swelling. EXAM: CT NECK WITH CONTRAST TECHNIQUE: Multidetector CT imaging of the neck was performed using the standard protocol following the bolus administration of intravenous contrast. RADIATION DOSE REDUCTION: This exam was performed according to the departmental dose-optimization program which includes automated exposure control, adjustment of the mA and/or kV according to patient size and/or use of iterative reconstruction technique. CONTRAST:  75mL OMNIPAQUE IOHEXOL 300 MG/ML  SOLN COMPARISON:  None. FINDINGS: Pharynx and larynx: Mucosal/submucosal edema within the supraglottic larynx, left greater than right (for instance as seen on series 2, image 73). Moderate airway effacement at this level. No appreciable swelling or mass within the oral cavity or pharynx.  No retropharyngeal collection. Salivary glands: Symmetric prominence of the submandibular glands with surrounding edema/fat stranding. Unremarkable appearance of the bilateral submandibular glands. No obstructing salivary stone is identified. Thyroid: Unremarkable. Lymph nodes: No pathologically enlarged lymph nodes identified. Vascular: The major vascular structures of the neck are patent. Atherosclerotic plaque within the visualized thoracic aorta, proximal major branch vessels of the neck and carotid arteries. Limited intracranial: No evidence of an acute intracranial abnormality within the field of view. Visualized orbits: No orbital mass or acute orbital finding. Mastoids and visualized paranasal sinuses: Mild mucosal thickening within the left frontal, bilateral ethmoid, bilateral sphenoid and bilateral maxillary sinuses. No significant mastoid effusion. 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 Grade 1 anterolisthesis at C4-C5, C5-C6, C6-C7 and C7-T1. Levocurvature of the cervical spine. Cervical spondylosis. The patient is edentulous. No acute fracture or aggressive osseous lesion. Upper chest: No consolidation within the imaged lung apices. Biapical pleuroparenchymal scarring. Small-volume secretions within the upper thoracic trachea (series 6, image for instance as seen on series 6, image 121). Other: Edema within the bilateral perimandibular/upper neck soft tissues (for instance as seen on series 2, image 85). IMPRESSION: 1. Mucosal/submucosal edema within the supraglottic larynx. This is compatible with acute supraglottitis in the appropriate clinical setting. However, close clinical follow-up (with imaging follow-up as warranted) is recommended to ensure symptom resolution with treatment, and to exclude a mucosal/submucosal neoplasm at this site. Moderate airway effacement at this level. 2. Symmetric prominence of the submandibular glands (with surrounding edema/fat stranding)  suspicious for acute submandibular gland sialoadenitis. 3. Small-volume secretions within the upper thoracic trachea. 4. Mild paranasal sinus mucosal thickening. 5. Developmental C2-C3 vertebral non-segmentation. 6. Levocurvature of the cervical spine and multilevel grade 1 spondylolisthesis. 7. Cervical spondylosis. 8. Aortic Atherosclerosis (ICD10-I70.0). Electronically Signed   By: Jackey Loge D.O.   On: 03/11/2023 13:28   Family Communication: Discussed with family at bedside, they understand and agree. All questions answereed.  Disposition: Status is: Inpatient Remains inpatient appropriate because: IV antibiotics, steroids for Supraglottitis   Planned Discharge Destination: Home     Time spent: 39 minutes  Author: Marcelino Duster, MD 03/13/2023 12:24 PM Secure chat 7am to 7pm For on call review [email or web address].

## 2023-03-13 NOTE — Progress Notes (Addendum)
...  03/13/2023 8:10 PM  Jones Skene [id number]     Temp:  [97.5 F (36.4 C)-98.7 F (37.1 C)] 97.9 F (36.6 C) (02/23 1542) Pulse Rate:  [54-80] 58 (02/23 1542) Resp:  [12-24] 18 (02/23 1542) BP: (93-129)/(44-66) 129/63 (02/23 1542) SpO2:  [93 %-100 %] 100 % (02/23 1542),     Intake/Output Summary (Last 24 hours) at 03/13/2023 2010 Last data filed at 03/13/2023 0900 Gross per 24 hour  Intake 100 ml  Output 1320 ml  Net -1220 ml    No results found for this or any previous visit (from the past 24 hours).  SUBJECTIVE:  No acute events.  Patient reports significant improvement in speech.  No breathing issues.  OBJECTIVE:  GEN-  NAD, alert and oriented.  Supine in bed OC/OP-  no masses or lesions NECK-  improved edema and  hypertrophy of submandibular glands  but still swollen more on left  Flexible laryngoscopy:  Please see separate procedure note for details.  Findings:  Significant improvement in piriform sinus edema with near normal left piriform sinus and only mild edema left on right.  No longer any pooling of secretions.  Base of tongue improved as well.  TVF mobile and larynx easily visible.  IMPRESSION:  Supraglottitis improving  PLAN:  OK for floor status.  Recommend checking for mumps as can present in adults with supraglottic swelling apparently.  Continue Decadron but switch to q12.  OK to d/c Vanc and use only Rocephin given significant improvement.  Cy Bresee 03/13/2023, 8:10 PM

## 2023-03-14 DIAGNOSIS — E782 Mixed hyperlipidemia: Secondary | ICD-10-CM | POA: Diagnosis not present

## 2023-03-14 DIAGNOSIS — I10 Essential (primary) hypertension: Secondary | ICD-10-CM | POA: Diagnosis not present

## 2023-03-14 DIAGNOSIS — E871 Hypo-osmolality and hyponatremia: Secondary | ICD-10-CM | POA: Diagnosis not present

## 2023-03-14 DIAGNOSIS — J0430 Supraglottitis, unspecified, without obstruction: Secondary | ICD-10-CM | POA: Diagnosis not present

## 2023-03-14 LAB — BASIC METABOLIC PANEL
Anion gap: 11 (ref 5–15)
BUN: 26 mg/dL — ABNORMAL HIGH (ref 8–23)
CO2: 26 mmol/L (ref 22–32)
Calcium: 9.1 mg/dL (ref 8.9–10.3)
Chloride: 98 mmol/L (ref 98–111)
Creatinine, Ser: 0.97 mg/dL (ref 0.61–1.24)
GFR, Estimated: >60 mL/min (ref >60)
Glucose, Bld: 114 mg/dL — ABNORMAL HIGH (ref 70–99)
Potassium: 3.5 mmol/L (ref 3.5–5.1)
Sodium: 135 mmol/L (ref 135–145)

## 2023-03-14 LAB — CBC
HCT: 41.6 % (ref 39.0–52.0)
Hemoglobin: 14.1 g/dL (ref 13.0–17.0)
MCH: 26.7 pg (ref 26.0–34.0)
MCHC: 33.9 g/dL (ref 30.0–36.0)
MCV: 78.8 fL — ABNORMAL LOW (ref 80.0–100.0)
Platelets: 343 10*3/uL (ref 150–400)
RBC: 5.28 MIL/uL (ref 4.22–5.81)
RDW: 14 % (ref 11.5–15.5)
WBC: 21.2 10*3/uL — ABNORMAL HIGH (ref 4.0–10.5)
nRBC: 0.1 % (ref 0.0–0.2)

## 2023-03-14 MED ORDER — AMOXICILLIN-POT CLAVULANATE 875-125 MG PO TABS: 1.0000 | ORAL_TABLET | Freq: Two times a day (BID) | ORAL | 0 refills | Status: AC

## 2023-03-14 NOTE — Care Management Important Message (Signed)
 Important Message  Patient Details  Name: William Osborne MRN: [id number] Date of Birth: 13-Aug-1955   Important Message Given:  Yes - Medicare IM     Cristela Blue, CMA 03/14/2023, 11:46 AM

## 2023-03-14 NOTE — Plan of Care (Signed)
  Problem: Education: Goal: Knowledge of General Education information will improve Description: Including pain rating scale, medication(s)/side effects and non-pharmacologic comfort measures Outcome: Progressing   Problem: Health Behavior/Discharge Planning: Goal: Ability to manage health-related needs will improve Outcome: Progressing   Problem: Clinical Measurements: Goal: Will remain free from infection Outcome: Progressing Goal: Diagnostic test results will improve Outcome: Progressing Goal: Cardiovascular complication will be avoided Outcome: Progressing   Problem: Activity: Goal: Risk for activity intolerance will decrease Outcome: Progressing   Problem: Nutrition: Goal: Adequate nutrition will be maintained Outcome: Progressing   Problem: Coping: Goal: Level of anxiety will decrease Outcome: Progressing   Problem: Elimination: Goal: Will not experience complications related to bowel motility Outcome: Progressing

## 2023-03-14 NOTE — Discharge Summary (Signed)
 Physician Discharge Summary   Patient: William Osborne MRN: [id number] DOB: [birth year]  Admit date:     03/11/2023  Discharge date: 03/14/23  Discharge Physician: Marcelino Duster   PCP: [facility], [facility]   Recommendations at discharge:  {Tip this will not be part of the note when signed- Example include specific recommendations for outpatient follow-up, pending tests to follow-up on. (Optional):26781}  ***  Discharge Diagnoses: Principal Problem:   Adult supraglottitis  Resolved Problems:   * No resolved hospital problems. [facility] Course: No notes on file  Assessment and Plan: No notes have been filed under this hospital service. Service: Hospitalist     {Tip this will not be part of the note when signed Body mass index is 28.78 kg/m. , ,  (Optional):26781}  {(NOTE) Pain control PDMP Statment (Optional):26782} Consultants: *** Procedures performed: ***  Disposition: {Plan; Disposition:26390} Diet recommendation:  Discharge Diet Orders (From admission, onward)     Start     Ordered   03/14/23 0000  Diet - low sodium heart healthy        03/14/23 1048           {Diet_Plan:26776} DISCHARGE MEDICATION: Allergies as of 03/14/2023       Reactions   Fish Allergy Hives, Swelling        Medication List     STOP taking these medications    losartan 50 MG tablet Commonly known as: COZAAR       TAKE these medications    amLODipine 2.5 MG tablet Commonly known as: NORVASC Take 2.5 mg by mouth daily.   amoxicillin-clavulanate 875-125 MG tablet Commonly known as: AUGMENTIN Take 1 tablet by mouth 2 (two) times daily for 3 days.   atorvastatin 40 MG tablet Commonly known as: LIPITOR Take 1 tablet by mouth daily.   etodolac 400 MG tablet Commonly known as: LODINE Take 1 tablet (400 mg total) by mouth 2 (two) times daily.        Follow-up Information     [facility] Follow up in 1 week(s).    Contact information: [address] [facility] [address] [place] [address] [phone]         Bud Face, MD. Call in 2 day(s).   Specialty: Otolaryngology Contact information: [address] [place] [address] [phone]                Discharge Exam: Ceasar Mons Weights   03/11/23 1039 03/11/23 1829  Weight: 63.5 kg 69.1 kg   ***  Condition at discharge: {DC Condition:26389}  The results of significant diagnostics from this hospitalization (including imaging, microbiology, ancillary and laboratory) are listed below for reference.   Imaging Studies: CT Soft Tissue Neck W Contrast Result Date: 03/11/2023 CLINICAL DATA:  Provided history: Epiglottitis or tonsillitis suspected. Additional history provided: The patient reports throat swelling, feeling as though something is stuck in throat. Visible neck swelling. EXAM: CT NECK WITH CONTRAST TECHNIQUE: Multidetector CT imaging of the neck was performed using the standard protocol following the bolus administration of intravenous contrast. RADIATION DOSE REDUCTION: This exam was performed according to the departmental dose-optimization program which includes automated exposure control, adjustment of the mA and/or kV according to patient size and/or use of iterative reconstruction technique. CONTRAST:  75mL OMNIPAQUE IOHEXOL 300 MG/ML  SOLN COMPARISON:  None. FINDINGS: Pharynx and larynx: Mucosal/submucosal edema within the supraglottic larynx, left greater than right (for instance as seen on series 2, image 73). Moderate airway  effacement at this level. No appreciable swelling or mass within the oral cavity or pharynx. No retropharyngeal collection. Salivary glands: Symmetric prominence of the submandibular glands with surrounding edema/fat stranding. Unremarkable appearance of the bilateral submandibular glands. No obstructing salivary stone is identified. Thyroid: Unremarkable. Lymph nodes: No  pathologically enlarged lymph nodes identified. Vascular: The major vascular structures of the neck are patent. Atherosclerotic plaque within the visualized thoracic aorta, proximal major branch vessels of the neck and carotid arteries. Limited intracranial: No evidence of an acute intracranial abnormality within the field of view. Visualized orbits: No orbital mass or acute orbital finding. Mastoids and visualized paranasal sinuses: Mild mucosal thickening within the left frontal, bilateral ethmoid, bilateral sphenoid and bilateral maxillary sinuses. No significant mastoid effusion. 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 Grade 1 anterolisthesis at C4-C5, C5-C6, C6-C7 and C7-T1. Levocurvature of the cervical spine. Cervical spondylosis. The patient is edentulous. No acute fracture or aggressive osseous lesion. Upper chest: No consolidation within the imaged lung apices. Biapical pleuroparenchymal scarring. Small-volume secretions within the upper thoracic trachea (series 6, image for instance as seen on series 6, image 121). Other: Edema within the bilateral perimandibular/upper neck soft tissues (for instance as seen on series 2, image 85). IMPRESSION: 1. Mucosal/submucosal edema within the supraglottic larynx. This is compatible with acute supraglottitis in the appropriate clinical setting. However, close clinical follow-up (with imaging follow-up as warranted) is recommended to ensure symptom resolution with treatment, and to exclude a mucosal/submucosal neoplasm at this site. Moderate airway effacement at this level. 2. Symmetric prominence of the submandibular glands (with surrounding edema/fat stranding) suspicious for acute submandibular gland sialoadenitis. 3. Small-volume secretions within the upper thoracic trachea. 4. Mild paranasal sinus mucosal thickening. 5. Developmental C2-C3 vertebral non-segmentation. 6. Levocurvature of the cervical spine and multilevel grade 1  spondylolisthesis. 7. Cervical spondylosis. 8. Aortic Atherosclerosis (ICD10-I70.0). Electronically Signed   By: Jackey Loge D.O.   On: 03/11/2023 13:28    Microbiology: Results for orders placed or performed during the hospital encounter of 03/11/23  Group A Strep by PCR ([facility] Only)     Status: None   Collection Time: 03/11/23 11:31 AM   Specimen: Throat; Sterile Swab  Result Value Ref Range Status   Group A Strep by PCR NOT DETECTED NOT DETECTED Final    Comment: Performed at [facility], [address] [place], [place] [address]  Resp panel by RT-PCR (RSV, Flu A&B, Covid) Throat     Status: None   Collection Time: 03/11/23 11:31 AM   Specimen: Throat; Nasal Swab  Result Value Ref Range Status   SARS Coronavirus 2 by RT PCR NEGATIVE NEGATIVE Final    Comment: (NOTE) SARS-CoV-2 target nucleic acids are NOT DETECTED.  The SARS-CoV-2 RNA is generally detectable in upper respiratory specimens during the acute phase of infection. The lowest concentration of SARS-CoV-2 viral copies this assay can detect is 138 copies/mL. A negative result does not preclude SARS-Cov-2 infection and should not be used as the sole basis for treatment or other patient management decisions. A negative result may occur with  improper specimen collection/handling, submission of specimen other than nasopharyngeal swab, presence of viral mutation(s) within the areas targeted by this assay, and inadequate number of viral copies(<138 copies/mL). A negative result must be combined with clinical observations, patient history, and epidemiological information. The expected result is Negative.  Fact Sheet for Patients:  BloggerCourse.com  Fact Sheet for Healthcare Providers:  SeriousBroker.it  This test is no t yet approved or  cleared by the [place] and  has been authorized for detection and/or diagnosis of SARS-CoV-2 by FDA under an  Emergency Use Authorization (EUA). This EUA will remain  in effect (meaning this test can be used) for the duration of the COVID-19 declaration under Section 564(b)(1) of the Act, 21 U.S.C.section 360bbb-3(b)(1), unless the authorization is terminated  or revoked sooner.       Influenza A by PCR NEGATIVE NEGATIVE Final   Influenza B by PCR NEGATIVE NEGATIVE Final    Comment: (NOTE) The Xpert Xpress SARS-CoV-2/FLU/RSV plus assay is intended as an aid in the diagnosis of influenza from Nasopharyngeal swab specimens and should not be used as a sole basis for treatment. Nasal washings and aspirates are unacceptable for Xpert Xpress SARS-CoV-2/FLU/RSV testing.  Fact Sheet for Patients: BloggerCourse.com  Fact Sheet for Healthcare Providers: SeriousBroker.it  This test is not yet approved or cleared by the [place] FDA and has been authorized for detection and/or diagnosis of SARS-CoV-2 by FDA under an Emergency Use Authorization (EUA). This EUA will remain in effect (meaning this test can be used) for the duration of the COVID-19 declaration under Section 564(b)(1) of the Act, 21 U.S.C. section 360bbb-3(b)(1), unless the authorization is terminated or revoked.     Resp Syncytial Virus by PCR NEGATIVE NEGATIVE Final    Comment: (NOTE) Fact Sheet for Patients: BloggerCourse.com  Fact Sheet for Healthcare Providers: SeriousBroker.it  This test is not yet approved or cleared by the [place] FDA and has been authorized for detection and/or diagnosis of SARS-CoV-2 by FDA under an Emergency Use Authorization (EUA). This EUA will remain in effect (meaning this test can be used) for the duration of the COVID-19 declaration under Section 564(b)(1) of the Act, 21 U.S.C. section 360bbb-3(b)(1), unless the authorization is terminated or revoked.  Performed at [facility], [address] [place], [place] [address]   Blood culture (routine x 2)     Status: None (Preliminary result)   Collection Time: 03/11/23  1:56 PM   Specimen: BLOOD  Result Value Ref Range Status   Specimen Description BLOOD BLOOD LEFT WRIST  Final   Special Requests   Final    BOTTLES DRAWN AEROBIC ONLY Blood Culture results may not be optimal due to an inadequate volume of blood received in culture bottles   Culture   Final    NO GROWTH 3 DAYS Performed at [facility], [address], [place], [place] [address]    Report Status PENDING  Incomplete  Blood culture (routine x 2)     Status: None (Preliminary result)   Collection Time: 03/11/23  2:10 PM   Specimen: BLOOD  Result Value Ref Range Status   Specimen Description BLOOD BLOOD RIGHT FOREARM  Final   Special Requests   Final    BOTTLES DRAWN AEROBIC ONLY Blood Culture results may not be optimal due to an inadequate volume of blood received in culture bottles   Culture   Final    NO GROWTH 3 DAYS Performed at [facility], [address] [place], [place] [address]    Report Status PENDING  Incomplete  Respiratory (~20 pathogens) panel by PCR     Status: None   Collection Time: 03/11/23  5:17 PM   Specimen: Nasopharyngeal Swab; Respiratory  Result Value Ref Range Status   Adenovirus NOT DETECTED NOT DETECTED Final   Coronavirus 229E NOT DETECTED NOT DETECTED Final    Comment: (NOTE) The Coronavirus on the Respiratory  Panel, DOES NOT test for the novel  Coronavirus (2019 nCoV)    Coronavirus HKU1 NOT DETECTED NOT DETECTED Final   Coronavirus NL63 NOT DETECTED NOT DETECTED Final   Coronavirus OC43 NOT DETECTED NOT DETECTED Final   Metapneumovirus NOT DETECTED NOT DETECTED Final   Rhinovirus / Enterovirus NOT DETECTED NOT DETECTED Final   Influenza A NOT DETECTED NOT DETECTED Final   Influenza B NOT DETECTED NOT DETECTED Final   Parainfluenza Virus 1 NOT DETECTED NOT DETECTED Final    Parainfluenza Virus 2 NOT DETECTED NOT DETECTED Final   Parainfluenza Virus 3 NOT DETECTED NOT DETECTED Final   Parainfluenza Virus 4 NOT DETECTED NOT DETECTED Final   Respiratory Syncytial Virus NOT DETECTED NOT DETECTED Final   Bordetella pertussis NOT DETECTED NOT DETECTED Final   Bordetella Parapertussis NOT DETECTED NOT DETECTED Final   Chlamydophila pneumoniae NOT DETECTED NOT DETECTED Final   Mycoplasma pneumoniae NOT DETECTED NOT DETECTED Final    Comment: Performed at [facility], [address], [place], [place] [address]  MRSA Next Gen by PCR, Nasal     Status: None   Collection Time: 03/11/23  6:29 PM   Specimen: Nasal Mucosa; Nasal Swab  Result Value Ref Range Status   MRSA by PCR Next Gen NOT DETECTED NOT DETECTED Final    Comment: (NOTE) The GeneXpert MRSA Assay (FDA approved for NASAL specimens only), is one component of a comprehensive MRSA colonization surveillance program. It is not intended to diagnose MRSA infection nor to guide or monitor treatment for MRSA infections. Test performance is not FDA approved in patients less than 59 years old. Performed at [facility], [address] [place], [place] [address]     Labs: CBC: Recent Labs  Lab 03/11/23 1131 03/12/23 0424 03/14/23 0540  WBC 16.2* 18.5* 21.2*  NEUTROABS 12.7*  --   --   HGB 13.6 14.2 14.1  HCT 40.8 43.5 41.6  MCV 80.2 81.0 78.8*  PLT 242 259 343   Basic Metabolic Panel: Recent Labs  Lab 03/11/23 1131 03/12/23 0424 03/14/23 0540  NA 133* 134* 135  K 3.6 3.9 3.5  CL 99 101 98  CO2 23 21* 26  GLUCOSE 108* 134* 114*  BUN 18 29* 26*  CREATININE 1.02 1.18 0.97  CALCIUM 9.4 9.2 9.1   Liver Function Tests: Recent Labs  Lab 03/11/23 1131  AST 23  ALT 21  ALKPHOS 56  BILITOT 1.1  PROT 7.8  ALBUMIN 3.8   CBG: Recent Labs  Lab 03/11/23 1835  GLUCAP 155*    Discharge time spent: 34 minutes.  Signed: Marcelino Duster, MD [facility] 03/14/2023

## 2023-03-15 LAB — MUMPS ANTIBODY, IGG: Mumps IgG: <9 [AU]/ml — ABNORMAL LOW (ref >10.9)

## 2023-03-16 LAB — CULTURE, BLOOD (ROUTINE X 2)
Culture: NO GROWTH
Culture: NO GROWTH

## 2023-04-08 LAB — MUMPS ANTIBODY, IGM: Mumps IgM: <0.8 [AU] (ref 0.00–0.79)

## 2023-04-14 ENCOUNTER — Encounter: Payer: Self-pay | Admitting: Emergency Medicine

## 2023-04-14 ENCOUNTER — Emergency Department

## 2023-04-14 ENCOUNTER — Observation Stay
Admission: EM | Admit: 2023-04-14 | Discharge: 2023-04-15 | Disposition: A | Discharge status: Home / Self Care | Attending: Internal Medicine | Admitting: Internal Medicine

## 2023-04-14 ENCOUNTER — Other Ambulatory Visit: Payer: Self-pay

## 2023-04-14 DIAGNOSIS — Z79899 Other long term (current) drug therapy: Secondary | ICD-10-CM | POA: Diagnosis not present

## 2023-04-14 DIAGNOSIS — I10 Essential (primary) hypertension: Secondary | ICD-10-CM | POA: Diagnosis present

## 2023-04-14 DIAGNOSIS — E663 Overweight: Secondary | ICD-10-CM | POA: Diagnosis present

## 2023-04-14 DIAGNOSIS — F17210 Nicotine dependence, cigarettes, uncomplicated: Secondary | ICD-10-CM | POA: Diagnosis not present

## 2023-04-14 DIAGNOSIS — I639 Cerebral infarction, unspecified: Secondary | ICD-10-CM | POA: Diagnosis not present

## 2023-04-14 DIAGNOSIS — E785 Hyperlipidemia, unspecified: Secondary | ICD-10-CM | POA: Diagnosis present

## 2023-04-14 DIAGNOSIS — Z6827 Body mass index (BMI) 27.0-27.9, adult: Secondary | ICD-10-CM | POA: Diagnosis not present

## 2023-04-14 DIAGNOSIS — Z720 Tobacco use: Secondary | ICD-10-CM | POA: Diagnosis present

## 2023-04-14 DIAGNOSIS — R29898 Other symptoms and signs involving the musculoskeletal system: Principal | ICD-10-CM

## 2023-04-14 DIAGNOSIS — R197 Diarrhea, unspecified: Secondary | ICD-10-CM | POA: Diagnosis not present

## 2023-04-14 DIAGNOSIS — E876 Hypokalemia: Secondary | ICD-10-CM | POA: Diagnosis present

## 2023-04-14 DIAGNOSIS — R200 Anesthesia of skin: Secondary | ICD-10-CM | POA: Diagnosis present

## 2023-04-14 HISTORY — DX: Hyperlipidemia, unspecified: E78.5

## 2023-04-14 HISTORY — DX: Tobacco use: Z72.0

## 2023-04-14 HISTORY — DX: Essential (primary) hypertension: I10

## 2023-04-14 LAB — COMPREHENSIVE METABOLIC PANEL WITH GFR
ALT: 13 U/L (ref 0–44)
AST: 17 U/L (ref 15–41)
Albumin: 3.6 g/dL (ref 3.5–5.0)
Alkaline Phosphatase: 54 U/L (ref 38–126)
Anion gap: 12 (ref 5–15)
BUN: 11 mg/dL (ref 8–23)
CO2: 20 mmol/L — ABNORMAL LOW (ref 22–32)
Calcium: 9 mg/dL (ref 8.9–10.3)
Chloride: 101 mmol/L (ref 98–111)
Creatinine, Ser: 0.99 mg/dL (ref 0.61–1.24)
GFR, Estimated: >60 mL/min (ref >60)
Glucose, Bld: 96 mg/dL (ref 70–99)
Potassium: 3.3 mmol/L — ABNORMAL LOW (ref 3.5–5.1)
Sodium: 133 mmol/L — ABNORMAL LOW (ref 135–145)
Total Bilirubin: 1 mg/dL (ref 0.0–1.2)
Total Protein: 6.8 g/dL (ref 6.5–8.1)

## 2023-04-14 LAB — DIFFERENTIAL
Abs Immature Granulocytes: 0.11 10*3/uL — ABNORMAL HIGH (ref 0.00–0.07)
Basophils Absolute: 0.1 10*3/uL (ref 0.0–0.1)
Basophils Relative: 1 %
Eosinophils Absolute: 0.1 10*3/uL (ref 0.0–0.5)
Eosinophils Relative: 1 %
Immature Granulocytes: 1 %
Lymphocytes Relative: 15 %
Lymphs Abs: 1.4 10*3/uL (ref 0.7–4.0)
Monocytes Absolute: 1.4 10*3/uL — ABNORMAL HIGH (ref 0.1–1.0)
Monocytes Relative: 15 %
Neutro Abs: 6.3 10*3/uL (ref 1.7–7.7)
Neutrophils Relative %: 67 %

## 2023-04-14 LAB — CBC
HCT: 39.6 % (ref 39.0–52.0)
Hemoglobin: 13.3 g/dL (ref 13.0–17.0)
MCH: 27 pg (ref 26.0–34.0)
MCHC: 33.6 g/dL (ref 30.0–36.0)
MCV: 80.3 fL (ref 80.0–100.0)
Platelets: 301 10*3/uL (ref 150–400)
RBC: 4.93 MIL/uL (ref 4.22–5.81)
RDW: 14.9 % (ref 11.5–15.5)
WBC: 9.4 10*3/uL (ref 4.0–10.5)
nRBC: 0 % (ref 0.0–0.2)

## 2023-04-14 LAB — MAGNESIUM: Magnesium: 1.5 mg/dL — ABNORMAL LOW (ref 1.7–2.4)

## 2023-04-14 LAB — CBG MONITORING, ED: Glucose-Capillary: 89 mg/dL (ref 70–99)

## 2023-04-14 MED ORDER — STROKE: EARLY STAGES OF RECOVERY BOOK: Freq: Once | Status: AC

## 2023-04-14 MED ORDER — SENNOSIDES-DOCUSATE SODIUM 8.6-50 MG PO TABS: 1.0000 | ORAL_TABLET | Freq: Every evening | ORAL | Status: DC | PRN

## 2023-04-14 MED ORDER — MAGNESIUM SULFATE 2 GM/50ML IV SOLN
2.0000 g | Freq: Once | INTRAVENOUS | Status: AC
  Administered 2023-04-15: 2 g via INTRAVENOUS
  Filled 2023-04-14: qty 50

## 2023-04-14 MED ORDER — ACETAMINOPHEN 160 MG/5ML PO SOLN: 650.0000 mg | ORAL | Status: DC | PRN

## 2023-04-14 MED ORDER — POTASSIUM CHLORIDE CRYS ER 20 MEQ PO TBCR
40.0000 meq | EXTENDED_RELEASE_TABLET | Freq: Once | ORAL | Status: AC
  Administered 2023-04-14: 40 meq via ORAL
  Filled 2023-04-14: qty 2

## 2023-04-14 MED ORDER — NICOTINE 21 MG/24HR TD PT24
21.0000 mg | MEDICATED_PATCH | Freq: Every day | TRANSDERMAL | Status: DC
  Filled 2023-04-14: qty 1

## 2023-04-14 MED ORDER — ACETAMINOPHEN 650 MG RE SUPP: 650.0000 mg | RECTAL | Status: DC | PRN

## 2023-04-14 MED ORDER — ONDANSETRON HCL 4 MG/2ML IJ SOLN: 4.0000 mg | Freq: Three times a day (TID) | INTRAMUSCULAR | Status: DC | PRN

## 2023-04-14 MED ORDER — HYDRALAZINE HCL 20 MG/ML IJ SOLN: 5.0000 mg | INTRAMUSCULAR | Status: DC | PRN

## 2023-04-14 MED ORDER — ACETAMINOPHEN 325 MG PO TABS: 650.0000 mg | ORAL_TABLET | ORAL | Status: DC | PRN

## 2023-04-14 MED ORDER — ENOXAPARIN SODIUM 40 MG/0.4ML IJ SOSY
40.0000 mg | PREFILLED_SYRINGE | INTRAMUSCULAR | Status: DC
  Administered 2023-04-15: 40 mg via SUBCUTANEOUS
  Filled 2023-04-14: qty 0.4

## 2023-04-14 MED ORDER — ASPIRIN 81 MG PO CHEW
324.0000 mg | CHEWABLE_TABLET | Freq: Once | ORAL | Status: AC
  Administered 2023-04-14: 324 mg via ORAL
  Filled 2023-04-14: qty 4

## 2023-04-14 NOTE — ED Notes (Signed)
 Dr. Scotty Court in triage to evaluate patient

## 2023-04-14 NOTE — H&P (Signed)
 History and Physical    William Osborne QQV:[id number] DOB: 1955-03-05 DOA: 04/14/2023  Referring MD/NP/PA:   PCP: [facility]   Patient coming from:  The patient is coming from home.     Chief Complaint: left hand numbness and weakness  HPI: William Osborne is a 68 y.o. male with medical history significant of HTN, HLD, tobacco abuse, who presents with left hand numbness and weakness.  Patient states that initially he started having numbness in his left index finger 4 days ago, no hand weakness.  When he woke up this AM, all fingers in left hand become numb, His left hand is weaker. He drops things in left hand.  Per his wife, he had mild slurred speech in the past 2 days, which has resolved.  No facial droop.  Denies weakness numbness in legs.  Patient does not have chest pain, cough, SOB.  Patient has diarrhea in the past several days, with 4-5 times of watery diarrhea today.  No nausea vomiting or abdominal pain.  No symptoms of UTI.  No fever or chills.  Of note, patient took Augmentin in February due to supraglottitis.  Patient states that he does not drink alcohol for a long time.  Data reviewed independently and ED Course: pt was found to have WBC 9.4, potassium 3.3, magnesium of 1.5, GFR> 60.  Temperature 99.3, blood pressure 113/65, heart rate 96, RR 18, oxygen saturation 96% on room air.  CT of head is negative for acute intracranial abnormalities.  MRI showed acute stroke.  Patient is placed in telemetry bed for placement.  MRI of the brain: Multiple small acute right frontoparietal, perirolandic infarcts.    EKG: I have personally reviewed.  Sinus rhythm, QTc 423, LAE, low voltage.   Review of Systems:   General: no fevers, chills, no body weight gain, has fatigue HEENT: no blurry vision, hearing changes or sore throat Respiratory: no dyspnea, coughing, wheezing CV: no chest pain, no palpitations GI: no nausea, vomiting, abdominal pain,  diarrhea, constipation GU: no dysuria, burning on urination, increased urinary frequency, hematuria  Ext: no leg edema Neuro:  no vision change or hearing loss. Has left hand numbness or weakness. Skin: no rash, no skin tear. MSK: No muscle spasm, no deformity, no limitation of range of movement in spin Heme: No easy bruising.  Travel history: No recent long distant travel.   Allergy:  Allergies  Allergen Reactions   Fish Allergy Hives and Swelling    Past Medical History:  Diagnosis Date   HLD (hyperlipidemia)    HTN (hypertension)    Tobacco abuse     Past Surgical History:  Procedure Laterality Date   COLONOSCOPY     EYE SURGERY     XI ROBOTIC ASSISTED INGUINAL HERNIA REPAIR WITH MESH Right 11/26/2019   Procedure: XI ROBOTIC ASSISTED INGUINAL HERNIA REPAIR WITH MESH;  Surgeon: Campbell Lerner, MD;  Location: [facility] ORS;  Service: General;  Laterality: Right;    Social History:  reports that he has been smoking cigarettes. He has a 12.5 pack-year smoking history. He has never used smokeless tobacco. He reports current alcohol use. He reports that he does not use drugs.  Family History:  Family History  Problem Relation Age of Onset   Diabetes Mother    Breast cancer Sister    Stomach cancer Sister      Prior to Admission medications   Medication Sig Start Date End Date Taking? Authorizing Provider  amLODipine (NORVASC) 2.5 MG tablet  Take 2.5 mg by mouth daily.    [provider]  atorvastatin (LIPITOR) 40 MG tablet Take 1 tablet by mouth daily. 12/27/22   [provider]  etodolac (LODINE) 400 MG tablet Take 1 tablet (400 mg total) by mouth 2 (two) times daily. Patient not taking: Reported on 03/11/2023 04/27/22   Tommi Rumps, PA-C    Physical Exam: Vitals:   04/14/23 1553 04/14/23 2000 04/14/23 2206  BP: 119/75 123/63 113/69  Pulse: 88 91 96  Resp: 16 18 18   Temp: 98.3 F (36.8 C)  99.3 F (37.4 C)  TempSrc: Oral  Oral  SpO2: 98% 96% 96%   Weight: 66.7 kg    Height: 5\' 1"  (1.549 m)     General: Not in acute distress HEENT:       Eyes: PERRL, EOMI, no jaundice       ENT: No discharge from the ears and nose, no pharynx injection, no tonsillar enlargement.        Neck: No JVD, no bruit, no mass felt. Heme: No neck lymph node enlargement. Cardiac: S1/S2, RRR, No murmurs, No gallops or rubs. Respiratory: No rales, wheezing, rhonchi or rubs. GI: Soft, nondistended, nontender, no rebound pain, no organomegaly, BS present. GU: No hematuria Ext: No pitting leg edema bilaterally. 1+DP/PT pulse bilaterally. Musculoskeletal: No joint deformities, No joint redness or warmth, no limitation of ROM in spin. Skin: No rashes.  Neuro: Alert, oriented X3, cranial nerves II-XII grossly intact, moves all extremities normally. Muscle strength 3/5 in left hand and 5/5 in right hand and both legs, sensation to light touch intact is decreased in left hand. Psych: Patient is not psychotic, no suicidal or hemocidal ideation.  Labs on Admission: I have personally reviewed following labs and imaging studies  CBC: Recent Labs  Lab 04/14/23 1555  WBC 9.4  NEUTROABS 6.3  HGB 13.3  HCT 39.6  MCV 80.3  PLT 301   Basic Metabolic Panel: Recent Labs  Lab 04/14/23 1555  NA 133*  K 3.3*  CL 101  CO2 20*  GLUCOSE 96  BUN 11  CREATININE 0.99  CALCIUM 9.0  MG 1.5*   GFR: Estimated Creatinine Clearance: 60.3 mL/min (by C-G formula based on SCr of 0.99 mg/dL). Liver Function Tests: Recent Labs  Lab 04/14/23 1555  AST 17  ALT 13  ALKPHOS 54  BILITOT 1.0  PROT 6.8  ALBUMIN 3.6   No results for input(s): "LIPASE", "AMYLASE" in the last 168 hours. No results for input(s): "AMMONIA" in the last 168 hours. Coagulation Profile: No results for input(s): "INR", "PROTIME" in the last 168 hours. Cardiac Enzymes: No results for input(s): "CKTOTAL", "CKMB", "CKMBINDEX", "TROPONINI" in the last 168 hours. BNP (last 3 results) No results for  input(s): "PROBNP" in the last 8760 hours. HbA1C: No results for input(s): "HGBA1C" in the last 72 hours. CBG: Recent Labs  Lab 04/14/23 1809  GLUCAP 89   Lipid Profile: No results for input(s): "CHOL", "HDL", "LDLCALC", "TRIG", "CHOLHDL", "LDLDIRECT" in the last 72 hours. Thyroid Function Tests: No results for input(s): "TSH", "T4TOTAL", "FREET4", "T3FREE", "THYROIDAB" in the last 72 hours. Anemia Panel: No results for input(s): "VITAMINB12", "FOLATE", "FERRITIN", "TIBC", "IRON", "RETICCTPCT" in the last 72 hours. Urine analysis: No results found for: "COLORURINE", "APPEARANCEUR", "LABSPEC", "PHURINE", "GLUCOSEU", "HGBUR", "BILIRUBINUR", "KETONESUR", "PROTEINUR", "UROBILINOGEN", "NITRITE", "LEUKOCYTESUR" Sepsis Labs: @LABRCNTIP (procalcitonin:4,lacticidven:4) )No results found for this or any previous visit (from the past 240 hours).   Radiological Exams on Admission:   Assessment/Plan Principal Problem:  Stroke [facility]) Active Problems:   HTN (hypertension)   HLD (hyperlipidemia)   Hypokalemia   Hypomagnesemia   Diarrhea   Tobacco abuse   Overweight (BMI 25.0-29.9)   Assessment and Plan:  Stroke [facility]): MRI of brain showed multiple small acute right frontoparietal, perirolandic infarcts.  - Placed on tele bed for observation - CTA of head and neck - will hold oral Bp meds to allow permissive HTN in the setting of acute stroke - ASA 325 mg daily - Statin: increased Lipitor dose from 40 to 80 mg daily - fasting lipid panel and HbA1c  - 2D transthoracic echocardiography with bubble - swallowing screen. If fails, will get SLP - Check UDS  - PT/OT consult  HTN (hypertension) - prn IV hydralazine for SBP>220 or dBP>110 - hold amlodipine  HLD (hyperlipidemia) -Lipitor  Hypokalemia and Hypomagnesemia: Potassium 3.3, magnesium of 1.5 -Repleted potassium and magnesium -Check phosphorus level  Diarrhea -Check C. difficile  Tobacco abuse -Nicotine  patch  Overweight (BMI 25.0-29.9): Body weight 66.7 kg, BMI 27.78 -Encourage losing weight -Exercise and healthy diet    DVT ppx: SQ Lovenox  Code Status: Full code   Family Communication:  Yes, patient's wife  at bed side.    Disposition Plan:  Anticipate discharge back to previous environment  Consults called:  none  Admission status and Level of care: Telemetry Medical:    for obs     Dispo: The patient is from: Home              Anticipated d/c is to: Home              Anticipated d/c date is: 1 day              Patient currently is not medically stable to d/c.    Severity of Illness:  The appropriate patient status for this patient is OBSERVATION. Observation status is judged to be reasonable and necessary in order to provide the required intensity of service to ensure the patient's safety. The patient's presenting symptoms, physical exam findings, and initial radiographic and laboratory data in the context of their medical condition is felt to place them at decreased risk for further clinical deterioration. Furthermore, it is anticipated that the patient will be medically stable for discharge from the hospital within 2 midnights of admission.        Date of Service 04/15/2023    Lorretta Harp [facility]   If 7PM-7AM, please contact night-coverage [email or web address] 04/15/2023, 12:16 AM

## 2023-04-14 NOTE — ED Notes (Signed)
 Pt and family still declining more blood work and IV until MRI results come back. Pt asked for water. Pt and family told pt can not eat or drink until results come back.

## 2023-04-14 NOTE — ED Triage Notes (Signed)
 Pt sent from PCP for eval of headache, left arm numbness and weakness, unable to lift left arm. First noticed symptoms a few days ago with numbness of left pinky finger, and numbness has progressed to include all fingers and most of his hand. Pt denies pain. Ambulatory w/o assistance. Denies blurry vision, dizziness, slurred speech, balance issues. Sensation intact but pt cannot make a fist with left hand. VAN-

## 2023-04-14 NOTE — ED Notes (Addendum)
 This RN went into pt's room to start IV and draw more labs. Pt's spouse refused this until MRI was complete. They stated "this is ridiculous" referring to the amount of time they had been waiting. This RN called MRI to see where on the list pt was at. RN was told pt was next. Family and pt informed.

## 2023-04-14 NOTE — ED Provider Notes (Signed)
 [facility] Provider Note    Event Date/Time   First MD Initiated Contact with Patient 04/14/23 1721     (approximate)   History   Numbness   HPI  William Osborne is a 68 y.o. male who presents to the ED for evaluation of Numbness   I review a medical DC summary from 1 month ago.  History of HTN, HLD and admitted for supraglottitis.  Managed with antibiotics.  Multiple flecks laryngoscopies with ENT with improvement of edema.  Patient presents with his wife for evaluation of left hand weakness since he awoke this morning.  He reports intermittent paresthesias and numbness to this hand for the past couple days but when he woke this morning his hand "was not working."  Wife also reports in the past 2 days his speech seems somewhat slurred and she thought she saw some mild facial asymmetry with his lips.   No falls or syncope, no head or neck trauma.    Physical Exam   Triage Vital Signs: ED Triage Vitals [04/14/23 1553]  Encounter Vitals Group     BP 119/75     Systolic BP Percentile      Diastolic BP Percentile      Pulse Rate 88     Resp 16     Temp 98.3 F (36.8 C)     Temp Source Oral     SpO2 98 %     Weight 147 lb (66.7 kg)     Height 5\' 1"  (1.549 m)     Head Circumference      Peak Flow      Pain Score 0     Pain Loc      Pain Education      Exclude from Growth Chart     Most recent vital signs: Vitals:   04/14/23 2000 04/14/23 2206  BP: 123/63 113/69  Pulse: 91 96  Resp: 18 18  Temp:  99.3 F (37.4 C)  SpO2: 96% 96%    General: Awake, no distress.  CV:  Good peripheral perfusion.  Resp:  Normal effort.  Abd:  No distention.  MSK:  No deformity noted.  No neck tenderness, step-offs or signs of trauma to the back or neck.  No trapezius tenderness on the left Neuro:  Mild facial asymmetry is noted, flattened fold on the left.  I do not appreciate any significant aphasia or dysarthria.  Left hand weakness is  obvious and notable.  Sensation is intact throughout. Other:     ED Results / Procedures / Treatments   Labs (all labs ordered are listed, but only abnormal results are displayed) Labs Reviewed  DIFFERENTIAL - Abnormal; Notable for the following components:      Result Value   Monocytes Absolute 1.4 (*)    Abs Immature Granulocytes 0.11 (*)    All other components within normal limits  COMPREHENSIVE METABOLIC PANEL WITH GFR - Abnormal; Notable for the following components:   Sodium 133 (*)    Potassium 3.3 (*)    CO2 20 (*)    All other components within normal limits  MAGNESIUM - Abnormal; Notable for the following components:   Magnesium 1.5 (*)    All other components within normal limits  CBC  URINE DRUG SCREEN, QUALITATIVE ([facility] ONLY)  PHOSPHORUS  CBG MONITORING, ED    EKG Sinus rhythm with a rate of 84 bpm.  Normal axis and intervals.  No clear signs of acute ischemia.  No  comparison.  RADIOLOGY CT head interpreted by me without evidence of acute intracranial pathology  Official radiology report(s): MR BRAIN WO CONTRAST Result Date: 04/14/2023 CLINICAL DATA:  eval cva. acute left hand weakness, speech changes, mild facial asymmetry EXAM: MRI HEAD WITHOUT CONTRAST TECHNIQUE: Multiplanar, multiecho pulse sequences of the brain and surrounding structures were obtained without intravenous contrast. COMPARISON:  None Available. FINDINGS: Brain: Multiple small acute right frontoparietal, perirolandic infarcts. Associated edema without mass effect. No midline shift. No evidence of acute hemorrhage, mass lesion, midline shift or hydrocephalus. Vascular: Major arterial flow voids are maintained at the skull base. Skull and upper cervical spine: Normal marrow signal. Retroflexed dens. Sinuses/Orbits: Negative. Other: No mastoid effusions. IMPRESSION: Multiple small acute right frontoparietal, perirolandic infarcts. Electronically Signed   By: Feliberto Harts M.D.   On: 04/14/2023  23:23   CT HEAD WO CONTRAST Result Date: 04/14/2023 CLINICAL DATA:  Numbness or tingling, paresthesia. Left hand weakness. EXAM: CT HEAD WITHOUT CONTRAST TECHNIQUE: Contiguous axial images were obtained from the base of the skull through the vertex without intravenous contrast. RADIATION DOSE REDUCTION: This exam was performed according to the departmental dose-optimization program which includes automated exposure control, adjustment of the mA and/or kV according to patient size and/or use of iterative reconstruction technique. COMPARISON:  None Available. FINDINGS: Brain: There is no evidence of an acute infarct, intracranial hemorrhage, mass, midline shift, or extra-axial fluid collection. Cerebral volume is within normal limits for age. The ventricles are normal in size. Vascular: Calcified atherosclerosis at the skull base. No hyperdense vessel. Skull: No acute fracture or suspicious lesion. Sinuses/Orbits: Visualized paranasal sinuses and mastoid air cells are clear. Unremarkable included orbits. Other: None. IMPRESSION: No evidence of acute intracranial abnormality. Electronically Signed   By: Sebastian Ache M.D.   On: 04/14/2023 16:52    PROCEDURES and INTERVENTIONS:  .1-3 Lead EKG Interpretation  Performed by: Delton Prairie, MD Authorized by: Delton Prairie, MD     Interpretation: normal     ECG rate:  80   ECG rate assessment: normal     Rhythm: sinus rhythm     Ectopy: none     Conduction: normal   .Critical Care  Performed by: Delton Prairie, MD Authorized by: Delton Prairie, MD   Critical care provider statement:    Critical care time (minutes):  30   Critical care time was exclusive of:  Separately billable procedures and treating other patients   Critical care was necessary to treat or prevent imminent or life-threatening deterioration of the following conditions:  CNS failure or compromise   Critical care was time spent personally by me on the following activities:  Development of  treatment plan with patient or surrogate, discussions with consultants, evaluation of patient's response to treatment, examination of patient, ordering and review of laboratory studies, ordering and review of radiographic studies, ordering and performing treatments and interventions, pulse oximetry, re-evaluation of patient's condition and review of old charts   Medications  ondansetron (ZOFRAN) injection 4 mg (has no administration in time range)  hydrALAZINE (APRESOLINE) injection 5 mg (has no administration in time range)  nicotine (NICODERM CQ - dosed in mg/24 hours) patch 21 mg (has no administration in time range)  magnesium sulfate IVPB 2 g 50 mL (has no administration in time range)  potassium chloride SA (KLOR-CON M) CR tablet 40 mEq (40 mEq Oral Given 04/14/23 1820)  aspirin chewable tablet 324 mg (324 mg Oral Given 04/14/23 1821)     IMPRESSION / MDM / ASSESSMENT AND  PLAN / ED COURSE  I reviewed the triage vital signs and the nursing notes.  Differential diagnosis includes, but is not limited to, stroke, cervical fracture or cord compression, demyelinating disease, ICH, carpal tunnel syndrome  {Patient presents with symptoms of an acute illness or injury that is potentially life-threatening.  Patient presents with left hand weakness concerning for central process such as CVA.  CT scan without bleeding, we will provide aspirin.  Mild hypokalemia is noted so we will replace orally.  We will provide aspirin, obtain a swallow screen and obtain MRI brain to assess for CVA.  I considered MRI C-spine but considering his other mild symptoms that wife reports speech changes and mild facial asymmetry more likely central.     Clinical Course as of 04/14/23 2331  Thu Apr 14, 2023  2033 Wife standing in the hallway and reports getting "furious" over the wait for MRI.  They have been here for under 5 hours.  I provided verbal reassurance [DS]  2330 Discussed with patient MRI results and signs  of stroke.  They are agreeable to stay.  I also consult with hospitalist who agrees to admit [DS]    Clinical Course User Index [DS] Delton Prairie, MD     FINAL CLINICAL IMPRESSION(S) / ED DIAGNOSES   Final diagnoses:  Left hand weakness  Acute CVA (cerebrovascular accident) [facility])     Rx / DC Orders   ED Discharge Orders     None        Note:  This document was prepared using Dragon voice recognition software and may include unintentional dictation errors.   Delton Prairie, MD 04/14/23 [phone]

## 2023-04-14 NOTE — ED Provider Notes (Signed)
 MSE was initiated and I personally evaluated the patient and placed orders (if any) at  4:06 PM on April 14, 2023.  Reports weakness / paralysis of left hand that he noticed this morning. LKW last night bedtime. No headache or other neuro complaints. Pt demonstrates normal wrist movement but unable to flex or extend fingers. Possible CVA - outside TNK window, and presentation not compatible with LVO.   The patient appears stable so that the remainder of the MSE may be completed by another provider.   Sharman Cheek, MD 04/14/23 [phone]

## 2023-04-15 ENCOUNTER — Observation Stay
Admit: 2023-04-15 | Discharge: 2023-04-15 | Disposition: A | Discharge status: Home / Self Care | Attending: Internal Medicine | Admitting: Internal Medicine

## 2023-04-15 ENCOUNTER — Observation Stay

## 2023-04-15 ENCOUNTER — Encounter: Payer: Self-pay | Admitting: Internal Medicine

## 2023-04-15 DIAGNOSIS — R197 Diarrhea, unspecified: Secondary | ICD-10-CM | POA: Diagnosis present

## 2023-04-15 DIAGNOSIS — I639 Cerebral infarction, unspecified: Secondary | ICD-10-CM | POA: Diagnosis not present

## 2023-04-15 DIAGNOSIS — F17210 Nicotine dependence, cigarettes, uncomplicated: Secondary | ICD-10-CM

## 2023-04-15 DIAGNOSIS — R29701 NIHSS score 1: Secondary | ICD-10-CM

## 2023-04-15 DIAGNOSIS — I6389 Other cerebral infarction: Secondary | ICD-10-CM

## 2023-04-15 DIAGNOSIS — E785 Hyperlipidemia, unspecified: Secondary | ICD-10-CM | POA: Diagnosis not present

## 2023-04-15 LAB — PHOSPHORUS: Phosphorus: 3.1 mg/dL (ref 2.5–4.6)

## 2023-04-15 LAB — LIPID PANEL
Cholesterol: 93 mg/dL (ref 0–200)
HDL: 31 mg/dL — ABNORMAL LOW (ref >40)
LDL Cholesterol: 50 mg/dL (ref 0–99)
Total CHOL/HDL Ratio: 3 ratio
Triglycerides: 60 mg/dL (ref <150)
VLDL: 12 mg/dL (ref 0–40)

## 2023-04-15 LAB — ECHOCARDIOGRAM COMPLETE BUBBLE STUDY
AR max vel: 2.83 cm2
AV Area VTI: 3.08 cm2
AV Area mean vel: 2.64 cm2
AV Mean grad: 3 mmHg
AV Peak grad: 4.8 mmHg
Ao pk vel: 1.1 m/s
Area-P 1/2: 3.46 cm2
S' Lateral: 2.2 cm

## 2023-04-15 LAB — HEMOGLOBIN A1C
Hgb A1c MFr Bld: 6 % — ABNORMAL HIGH (ref 4.8–5.6)
Mean Plasma Glucose: 125.5 mg/dL

## 2023-04-15 MED ORDER — CLOPIDOGREL BISULFATE 75 MG PO TABS: 75.0000 mg | ORAL_TABLET | Freq: Every day | ORAL | 0 refills | Status: AC

## 2023-04-15 MED ORDER — CLOPIDOGREL BISULFATE 75 MG PO TABS
75.0000 mg | ORAL_TABLET | Freq: Every day | ORAL | Status: DC
  Administered 2023-04-15: 75 mg via ORAL
  Filled 2023-04-15: qty 1

## 2023-04-15 MED ORDER — ATORVASTATIN CALCIUM 20 MG PO TABS
80.0000 mg | ORAL_TABLET | Freq: Every day | ORAL | Status: DC
  Administered 2023-04-15: 80 mg via ORAL
  Filled 2023-04-15: qty 4

## 2023-04-15 MED ORDER — ASPIRIN 325 MG PO TBEC
325.0000 mg | DELAYED_RELEASE_TABLET | Freq: Every day | ORAL | Status: DC
  Administered 2023-04-15: 325 mg via ORAL
  Filled 2023-04-15: qty 1

## 2023-04-15 MED ORDER — ATORVASTATIN CALCIUM 20 MG PO TABS
40.0000 mg | ORAL_TABLET | Freq: Every day | ORAL | Status: DC
Start: 2023-04-16 — End: 2023-04-15

## 2023-04-15 MED ORDER — ASPIRIN 81 MG PO TBEC: 81.0000 mg | DELAYED_RELEASE_TABLET | Freq: Every day | ORAL | 12 refills | Status: AC

## 2023-04-15 MED ORDER — IOHEXOL 350 MG/ML SOLN
75.0000 mL | Freq: Once | INTRAVENOUS | Status: AC | PRN
  Administered 2023-04-15: 75 mL via INTRAVENOUS

## 2023-04-15 NOTE — Progress Notes (Signed)
*  PRELIMINARY RESULTS* Echocardiogram 2D Echocardiogram has been performed.  Cristela Blue 04/15/2023, 2:50 PM

## 2023-04-15 NOTE — Evaluation (Signed)
 Occupational Therapy Evaluation Patient Details Name: William Osborne MRN: [id number] DOB: Feb 20, 1955 Today's Date: 04/15/2023   History of Present Illness   68 y/o male presented to ED on 04/14/23 for headache and L arm weakness and numbness. MRI found multiple small acute R frontoparietal infarcts. PMH: HTN, tobacco abuse     Clinical Impressions Upon entering the room, pt supine in ED stretcher with wife present in room. Pt is agreeable to OT evaluation. Pt reports being Ind at baseline without use of AD. Pt continues to be Ind with mobility at this time and functional transfers. Pt with c/o L second digit numbness. Grip strength is functional but not at baseline. Pt with difficulty performing opposition and decreased speed with fine motor tasks. OT reviewed exercises and self care tasks to address these deficits with recommendation for outpt OT at discharge. No further skilled acute OT need at this time.       Functional Status Assessment   Patient has not had a recent decline in their functional status     Equipment Recommendations   None recommended by OT      Precautions/Restrictions   Precautions Precautions: None Recall of Precautions/Restrictions: Intact     Mobility Bed Mobility Overal bed mobility: Independent                  Transfers Overall transfer level: Independent                        Balance Overall balance assessment: No apparent balance deficits (not formally assessed)                                         ADL either performed or assessed with clinical judgement   ADL Overall ADL's : Needs assistance/impaired                                       General ADL Comments: Pt needing assistance with buttons and ties of clothing but overall Ind     Vision Patient Visual Report: No change from baseline              Pertinent Vitals/Pain Pain Assessment Pain Assessment:  No/denies pain     Extremity/Trunk Assessment Upper Extremity Assessment Upper Extremity Assessment: LUE deficits/detail LUE Deficits / Details: functional grip strength but decreased froom baseline. Decreased dexterity and speed for coordination and numbness at index finger only.           Communication Communication Communication: No apparent difficulties   Cognition Arousal: Alert Behavior During Therapy: WFL for tasks assessed/performed Cognition: No apparent impairments                               Following commands: Intact                  Home Living Family/patient expects to be discharged to:: Private residence Living Arrangements: Spouse/significant other Available Help at Discharge: Family;Available 24 hours/day Type of Home: House Home Access: Stairs to enter [employer] of Steps: 4 Entrance Stairs-Rails: Right;Left Home Layout: One level     Bathroom Shower/Tub: Tub/shower unit         Home Equipment: None  Prior Functioning/Environment Prior Level of Function : Independent/Modified Independent;Driving                            OT Goals(Current goals can be found in the care plan section)   Acute Rehab OT Goals Patient Stated Goal: to go home and L UE return to normal OT Goal Formulation: With patient/family Time For Goal Achievement: 04/15/23 Potential to Achieve Goals: Fair   OT Frequency:          AM-PAC OT "6 Clicks" Daily Activity     Outcome Measure Help from another person eating meals?: None Help from another person taking care of personal grooming?: None Help from another person toileting, which includes using toliet, bedpan, or urinal?: None Help from another person bathing (including washing, rinsing, drying)?: None Help from another person to put on and taking off regular upper body clothing?: A Little Help from another person to put on and taking off regular lower body  clothing?: A Little 6 Click Score: 22   End of Session    Activity Tolerance: Patient tolerated treatment well Patient left: in bed;with call bell/phone within reach;with family/visitor present  OT Visit Diagnosis:  (strength and coordination deficit)                Time: 4098-1191 OT Time Calculation (min): 21 min Charges:  OT General Charges $OT Visit: 1 Visit OT Evaluation $OT Eval Low Complexity: 1 Low  Jackquline Denmark, MS, OTR/L , CBIS ascom [phone]  04/15/23, 1:27 PM

## 2023-04-15 NOTE — ED Notes (Signed)
 Pt passed swallow screen, given ice water, and no other needs at this time. Call bell within reach.

## 2023-04-15 NOTE — Consult Note (Signed)
 NEUROLOGY CONSULT NOTE   Date of service: April 15, 2023 Patient Name: William Osborne MRN:  [id number] DOB:  1955-07-11 Chief Complaint: acute ischemic stroke Requesting Provider: Harold Hedge, MD  History of Present Illness   This is a 68 year old man with past medical history significant for hypertension, hyperlipidemia, tobacco abuse who presents with left hand numbness and weakness.  He initially started having numbness in his left index finger 4 days ago with no weakness.  When he woke up yesterday morning all the fingers in his left hand were numb and he had weakness primarily in his left thumb and also his index finger.  He cannot make a full grip on that side and drops things when they are in the left hand.  Wife states that he did have mild slurred speech in the past 2 days but that has now resolved.  Stroke scale is 1 for sensory deficit.  Although he has distal left hand weakness into fingers he does not have any drift on that side or in any other extremity.  MRI brain showed multiple small acute right frontoparietal, Perry rolandic infarcts.  CTA head and neck showed no hemodynamically significant stenosis.  LDL was 50.  TTE is pending.  NIHSS components Score: Comment  1a Level of Conscious 0[]  1[]  2[]  3[]      1b LOC Questions 0[]  1[]  2[]       1c LOC Commands 0[]  1[]  2[]       2 Best Gaze 0[]  1[]  2[]       3 Visual 0[]  1[]  2[]  3[]      4 Facial Palsy 0[]  1[]  2[]  3[]      5a Motor Arm - left 0[]  1[]  2[]  3[]  4[]  UN[]    5b Motor Arm - Right 0[]  1[]  2[]  3[]  4[]  UN[]    6a Motor Leg - Left 0[]  1[]  2[]  3[]  4[]  UN[]    6b Motor Leg - Right 0[]  1[]  2[]  3[]  4[]  UN[]    7 Limb Ataxia 0[]  1[]  2[]  3[]  UN[]     8 Sensory 0[]  1[x]  2[]  UN[]      9 Best Language 0[]  1[]  2[]  3[]      10 Dysarthria 0[]  1[]  2[]  UN[]      11 Extinct. and Inattention 0[]  1[]  2[]       TOTAL:  1      ROS  Comprehensive ROS performed and pertinent positives documented in HPI   Past History   Past  Medical History:  Diagnosis Date   HLD (hyperlipidemia)    HTN (hypertension)    Tobacco abuse     Past Surgical History:  Procedure Laterality Date   COLONOSCOPY     EYE SURGERY     XI ROBOTIC ASSISTED INGUINAL HERNIA REPAIR WITH MESH Right 11/26/2019   Procedure: XI ROBOTIC ASSISTED INGUINAL HERNIA REPAIR WITH MESH;  Surgeon: Campbell Lerner, MD;  Location: [facility] ORS;  Service: General;  Laterality: Right;    Family History: Family History  Problem Relation Age of Onset   Diabetes Mother    Breast cancer Sister    Stomach cancer Sister     Social History  reports that he has been smoking cigarettes. He has a 12.5 pack-year smoking history. He has never used smokeless tobacco. He reports current alcohol use. He reports that he does not use drugs.  Allergies  Allergen Reactions   Fish Allergy Hives and Swelling    Medications   Current Facility-Administered Medications:     stroke: early stages of recovery book, , Does not apply,  Once, Lorretta Harp, MD   acetaminophen (TYLENOL) tablet 650 mg, 650 mg, Oral, Q4H PRN **OR** acetaminophen (TYLENOL) 160 MG/5ML solution 650 mg, 650 mg, Per Tube, Q4H PRN **OR** acetaminophen (TYLENOL) suppository 650 mg, 650 mg, Rectal, Q4H PRN, Lorretta Harp, MD   aspirin EC tablet 325 mg, 325 mg, Oral, Daily, Lorretta Harp, MD, 325 mg at 04/15/23 0924   [START ON 04/16/2023] atorvastatin (LIPITOR) tablet 40 mg, 40 mg, Oral, Daily, Jefferson Fuel, MD   clopidogrel (PLAVIX) tablet 75 mg, 75 mg, Oral, Daily, Jefferson Fuel, MD, 75 mg at 04/15/23 1218   enoxaparin (LOVENOX) injection 40 mg, 40 mg, Subcutaneous, Q24H, Lorretta Harp, MD, 40 mg at 04/15/23 0054   hydrALAZINE (APRESOLINE) injection 5 mg, 5 mg, Intravenous, Q2H PRN, Lorretta Harp, MD   nicotine (NICODERM CQ - dosed in mg/24 hours) patch 21 mg, 21 mg, Transdermal, Daily, Lorretta Harp, MD   ondansetron (ZOFRAN) injection 4 mg, 4 mg, Intravenous, Q8H PRN, Lorretta Harp, MD   senna-docusate (Senokot-S)  tablet 1 tablet, 1 tablet, Oral, QHS PRN, Lorretta Harp, MD  Current Outpatient Medications:    amLODipine (NORVASC) 2.5 MG tablet, Take 2.5 mg by mouth daily., Disp: , Rfl:    atorvastatin (LIPITOR) 40 MG tablet, Take 1 tablet by mouth daily., Disp: , Rfl:    nystatin (MYCOSTATIN) 100000 UNIT/ML suspension, Take 5 mLs by mouth 4 (four) times daily. (Patient not taking: Reported on 04/15/2023), Disp: , Rfl:   Vitals   Vitals:   04/15/23 0926 04/15/23 1115 04/15/23 1330 04/15/23 1430  BP:  137/77 137/62 (!) 110/58  Pulse:  81 88 81  Resp:  17 13 20   Temp: 98.3 F (36.8 C)     TempSrc: Oral     SpO2:  97% 97% 97%  Weight:      Height:        Body mass index is 27.78 kg/m.  Physical Exam    Gen: patient lying in bed, NAD CV: extremities appear well-perfused Resp: normal WOB  Neurologic Examination   MS: alert, oriented x4, follows commands Speech: no dysarthria, no aphasia CN: PERRL, VFF, EOMI, sensation intact, face symmetric, hearing intact to voice Motor: 5/5 strength throughout except weakness in L grip primarily in median nerve distribution (no drift in any extremity) Sensory: sensory deficit L hand Reflexes: 2+ symm with toes down bilat Coordination: FNF intact bilat Gait: deferred  Labs/Imaging/Neurodiagnostic studies   CBC:  Recent Labs  Lab 2023-04-21 1555  WBC 9.4  NEUTROABS 6.3  HGB 13.3  HCT 39.6  MCV 80.3  PLT 301   Basic Metabolic Panel:  Lab Results  Component Value Date   NA 133 (L) 04/21/23   K 3.3 (L) 2023-04-21   CO2 20 (L) 2023-04-21   GLUCOSE 96 04-21-23   BUN 11 April 21, 2023   CREATININE 0.99 April 21, 2023   CALCIUM 9.0 04/21/23   GFRNONAA >60 04-21-2023   GFRAA >60 09/09/2019   Lipid Panel:  Lab Results  Component Value Date   LDLCALC 50 04/15/2023   HgbA1c:  Lab Results  Component Value Date   HGBA1C 6.0 (H) 04/15/2023   Urine Drug Screen: No results found for: "LABOPIA", "COCAINSCRNUR", "LABBENZ", "AMPHETMU", "THCU",  "LABBARB"  Alcohol Level No results found for: "ETH" INR No results found for: "INR" APTT No results found for: "APTT" AED levels: No results found for: "PHENYTOIN", "ZONISAMIDE", "LAMOTRIGINE", "LEVETIRACETA"  CT angio Head and Neck with contrast(Personally reviewed): No emergent large vessel occlusion or proximal hemodynamically significant stenosis.  MRI Brain(Personally reviewed): Multiple small acute right frontoparietal, perirolandic infarcts.   TTE 1. Negative bubble study. 2. Left ventricular ejection fraction, by estimation, is 65 to 70% . The left ventricle has normal function. The left ventricle demonstrates regional wall motion abnormalities ( see scoring diagram/ findings for description) . Left ventricular diastolic parameters were normal. 3. Right ventricular systolic function is normal. The right ventricular size is normal. 4. The mitral valve is grossly normal. Trivial mitral valve regurgitation. 5. The aortic valve is normal in structure. Aortic valve regurgitation is not visualized. 6. Agitated saline contrast bubble study was negative, with no evidence of any interatrial shunt.  ASSESSMENT   This is a 68 year old man with past medical history significant for hypertension, hyperlipidemia, tobacco abuse who presents with left hand numbness and weakness.  Etiology is favored to be small vessel disease vs central embolic. Stroke workup is now completed.  RECOMMENDATIONS   - ASA 81mg  daily + plavix 75mg  daily x21 days f/b ASA 81mg  daily monotherapy after that - Continue home atorvastatin, LDL is at goal - PT/OT/SLP - Stroke education - Smoking cessation counseling - Amb referral to neurology upon discharge (I will arrange) - OK to discharge today, will need ambulatory cardiac monitoring upon or soon after hospital discharge ______________________________________________________________________    Signed, Jefferson Fuel, MD [facility]

## 2023-04-15 NOTE — Progress Notes (Addendum)
 Per Dr. Juliann Pares, pt to go to [facility] on Monday, 3/31 for 30-day cardiac monitor placement. Instructions and phone number for office written on discharge paperwork. Outpatient neurology referral- phone number to Dr. Daisy Blossom office written on discharge instructions. Instructions given to patient and family on discharge with RN Maryan Char  Stroke Coordinator

## 2023-04-15 NOTE — Progress Notes (Addendum)
 SLP Cancellation Note  Patient Details Name: William Osborne MRN: [id number] DOB: 02-May-1955   Cancelled treatment:       Reason Eval/Treat Not Completed: SLP screened, no needs identified, will sign off (chart reviewed; consulted w/ NSG/pt)  Pt denied any difficulty swallowing and is currently on a regular diet; tolerates swallowing pills w/ water per NSG. He reported recent h/o "sore throat from a viral infection"; he is following w/ his PCP re: this and had no current concerns re: it.  Pt conversed in full conversation w/out expressive/receptive deficits noted; pt denied any speech-language deficits. Speech clear. Pt asked for coffee, condiments which were provided. Quite pleasant.  No further skilled ST services indicated as pt appears at his baseline. Pt agreed. NSG to reconsult if any change in status while admitted.      Jerilynn Som, MS, CCC-SLP Speech Language Pathologist Rehab Services; [facility] [phone] (ascom) William Osborne 04/15/2023, 8:31 AM

## 2023-04-15 NOTE — Discharge Summary (Signed)
 Physician Discharge Summary   Patient: William Osborne MRN: [id number] DOB: [birth year]  Admit date:     04/14/2023  Discharge date: 04/15/23  Discharge Physician: Harold Hedge   PCP: [facility]   Recommendations at discharge:  {Tip this will not be part of the note when signed- Example include specific recommendations for outpatient follow-up, pending tests to follow-up on. (Optional):[id number] Follow-up with PCP in 1 week Follow-up with neurology, neurologist will arrange the follow-up Discharge Diagnoses: Principal Problem:   Stroke [facility]) Active Problems:   HTN (hypertension)   HLD (hyperlipidemia)   Hypokalemia   Hypomagnesemia   Diarrhea   Tobacco abuse   Overweight (BMI 25.0-29.9)   Hospital Course: This is a 68 year old man with past medical history significant for hypertension, hyperlipidemia, tobacco abuse who presents with left hand numbness and weakness. He initially started having numbness in his left index finger 4 days ago with no weakness. When he woke up yesterday morning all the fingers in his left hand were numb and he had weakness primarily in his left thumb and also his index finger. He cannot make a full grip on that side and drops things when they are in the left hand. Wife states that he did have mild slurred speech in the past 2 days but that has now resolved. Stroke scale is 1 for sensory deficit. Although he has distal left hand weakness into fingers he does not have any drift on that side or in any other extremity. MRI brain showed multiple small acute right frontoparietal, Perry rolandic infarcts. CTA head and neck showed no hemodynamically significant stenosis. LDL was 50.  Patient was admitted by the hospitalist team, evaluated by the neurologist.  Underwent transthoracic echocardiogram with bubble study that came back negative for intra-atrial shunt.  Neurologist evaluated the patient advised that the patient could be discharged on  aspirin 81 mg and Plavix 75 mg for 21 days.  She advised to continue the same home dose of atorvastatin as LDL was at goal.  Presented to be evaluated by PT OT and cleared for discharge.  At the time of discharge cardiology was consulted I had to initiate ambulatory cardiac monitoring.  Post discharge patient is to follow-up with PCP in 1 week.  Neurologist advised that she will arrange for her to follow-up with her upon discharge.  Assessment and Plan: No notes have been filed under this hospital service. Service: Hospitalist       Consultants: Neurology Procedures performed: TTE Disposition: Home Diet recommendation:  Discharge Diet Orders (From admission, onward)     Start     Ordered   04/15/23 0000  Diet - low sodium heart healthy        04/15/23 1526           Cardiac diet DISCHARGE MEDICATION:   Discharge Exam: Filed Weights   04/14/23 1553  Weight: 66.7 kg   Patient seen and examined: Alert and oriented x 3 CVs: S1-S2 positive Respiratory: Bilateral clear and equal breath sounds Abdomen is: Soft nondistended nontender bowel sounds positive  Condition at discharge: fair  The results of significant diagnostics from this hospitalization (including imaging, microbiology, ancillary and laboratory) are listed below for reference.   Imaging Studies: ECHOCARDIOGRAM COMPLETE BUBBLE STUDY Result Date: 04/15/2023    ECHOCARDIOGRAM REPORT   Patient Name:   William Osborne Date of Exam: 04/15/2023 Medical Rec #:  [id number]  Height:       61.0 in Accession #:    [id number]               Weight:       147.0 lb Date of Birth:  11-14-1955               BSA:          1.657 m Patient Age:    67 years                 BP: Patient Gender: M                        HR: Exam Location: Procedure: (Both Spectral and Color Flow Doppler were utilized during            procedure).  Referring Phys: [id number] Brien Few NIU IMPRESSIONS  1. Negative bubble study.  2. Left  ventricular ejection fraction, by estimation, is 65 to 70%. The left ventricle has normal function. The left ventricle demonstrates regional wall motion abnormalities (see scoring diagram/findings for description). Left ventricular diastolic parameters were normal.  3. Right ventricular systolic function is normal. The right ventricular size is normal.  4. The mitral valve is grossly normal. Trivial mitral valve regurgitation.  5. The aortic valve is normal in structure. Aortic valve regurgitation is not visualized.  6. Agitated saline contrast bubble study was negative, with no evidence of any interatrial shunt. FINDINGS  Left Ventricle: Left ventricular ejection fraction, by estimation, is 65 to 70%. The left ventricle has normal function. The left ventricle demonstrates regional wall motion abnormalities. Strain was performed and the global longitudinal strain is indeterminate. Global longitudinal strain performed but not reported based on interpreter judgement due to suboptimal tracking. The left ventricular internal cavity size was normal in size. There is borderline concentric left ventricular hypertrophy. Left ventricular diastolic parameters were normal. Right Ventricle: The right ventricular size is normal. No increase in right ventricular wall thickness. Right ventricular systolic function is normal. Left Atrium: Left atrial size was normal in size. Right Atrium: Right atrial size was normal in size. Pericardium: There is no evidence of pericardial effusion. Mitral Valve: The mitral valve is grossly normal. Trivial mitral valve regurgitation. Tricuspid Valve: The tricuspid valve is normal in structure. Tricuspid valve regurgitation is mild. Aortic Valve: The aortic valve is normal in structure. Aortic valve regurgitation is not visualized. Aortic valve mean gradient measures 3.0 mmHg. Aortic valve peak gradient measures 4.8 mmHg. Aortic valve area, by VTI measures 3.08 cm. Pulmonic Valve: The pulmonic  valve was normal in structure. Pulmonic valve regurgitation is not visualized. Aorta: The ascending aorta was not well visualized. IAS/Shunts: No atrial level shunt detected by color flow Doppler. Agitated saline contrast bubble study was negative, with no evidence of any interatrial shunt. Additional Comments: Negative bubble study. 3D was performed not requiring image post processing on an independent workstation and was indeterminate.  LEFT VENTRICLE PLAX 2D LVIDd:         3.40 cm   Diastology LVIDs:         2.20 cm   LV e' medial:    12.20 cm/s LV PW:         1.00 cm   LV E/e' medial:  7.3 LV IVS:        1.30 cm   LV e' lateral:   12.40 cm/s LVOT diam:     2.00 cm   LV E/e' lateral: 7.2 LV SV:  60 LV SV Index:   36 LVOT Area:     3.14 cm  RIGHT VENTRICLE RV Basal diam:  2.00 cm RV Mid diam:    1.80 cm LEFT ATRIUM             Index        RIGHT ATRIUM          Index LA diam:        3.60 cm 2.17 cm/m   RA Area:     9.39 cm LA Vol (A2C):   34.2 ml 20.64 ml/m  RA Volume:   16.00 ml 9.66 ml/m LA Vol (A4C):   21.2 ml 12.79 ml/m LA Biplane Vol: 28.2 ml 17.02 ml/m  AORTIC VALVE AV Area (Vmax):    2.83 cm AV Area (Vmean):   2.64 cm AV Area (VTI):     3.08 cm AV Vmax:           110.00 cm/s AV Vmean:          75.100 cm/s AV VTI:            0.194 m AV Peak Grad:      4.8 mmHg AV Mean Grad:      3.0 mmHg LVOT Vmax:         99.00 cm/s LVOT Vmean:        63.200 cm/s LVOT VTI:          0.190 m LVOT/AV VTI ratio: 0.98  AORTA Ao Root diam: 2.70 cm MITRAL VALVE               TRICUSPID VALVE MV Area (PHT): 3.46 cm    TR Peak grad:   18.5 mmHg MV Decel Time: 219 msec    TR Vmax:        215.00 cm/s MV E velocity: 89.10 cm/s MV A velocity: 88.60 cm/s  SHUNTS MV E/A ratio:  1.01        Systemic VTI:  0.19 m                            Systemic Diam: 2.00 cm Alwyn Pea MD Electronically signed by Alwyn Pea MD Signature Date/Time: 04/15/2023/2:58:46 PM    Final    CT ANGIO HEAD NECK W WO CM Result Date:  04/15/2023 CLINICAL DATA:  Stroke/TIA, determine embolic source EXAM: CT ANGIOGRAPHY HEAD AND NECK WITH AND WITHOUT CONTRAST TECHNIQUE: Multidetector CT imaging of the head and neck was performed using the standard protocol during bolus administration of intravenous contrast. Multiplanar CT image reconstructions and MIPs were obtained to evaluate the vascular anatomy. Carotid stenosis measurements (when applicable) are obtained utilizing NASCET criteria, using the distal internal carotid diameter as the denominator. RADIATION DOSE REDUCTION: This exam was performed according to the departmental dose-optimization program which includes automated exposure control, adjustment of the mA and/or kV according to patient size and/or use of iterative reconstruction technique. CONTRAST:  75mL OMNIPAQUE IOHEXOL 350 MG/ML SOLN COMPARISON:  MRI and CT head from March 27, 25. FINDINGS: CTA NECK FINDINGS Aortic arch: Aortic atherosclerosis. Great vessel origins are patent. Right carotid system: No evidence of dissection, stenosis (50% or greater), or occlusion. Left carotid system: No evidence of dissection, stenosis (50% or greater), or occlusion. Vertebral arteries: Right dominant. No evidence of dissection, stenosis (50% or greater), or occlusion. Skeleton: No acute abnormality on limited assessment. 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 Other neck: No acute abnormality on limited assessment. Upper  chest: Visualized lung apices are clear. Review of the MIP images confirms the above findings CTA HEAD FINDINGS Anterior circulation: Bilateral intracranial ICAs, MCAs, and ACAs are patent without proximal hemodynamically significant stenosis. Posterior circulation: Hypoplastic left vertebral artery. The right vertebral artery, basilar artery and bilateral posterior cerebral arteries are patent without proximal hemodynamically significant stenosis. Venous sinuses: As permitted by contrast timing, patent. Anatomic variants:  Detailed above. Review of the MIP images confirms the above findings IMPRESSION: No emergent large vessel occlusion or proximal hemodynamically significant stenosis. Electronically Signed   By: Feliberto Harts M.D.   On: 04/15/2023 01:07   MR BRAIN WO CONTRAST Result Date: 04/14/2023 CLINICAL DATA:  eval cva. acute left hand weakness, speech changes, mild facial asymmetry EXAM: MRI HEAD WITHOUT CONTRAST TECHNIQUE: Multiplanar, multiecho pulse sequences of the brain and surrounding structures were obtained without intravenous contrast. COMPARISON:  None Available. FINDINGS: Brain: Multiple small acute right frontoparietal, perirolandic infarcts. Associated edema without mass effect. No midline shift. No evidence of acute hemorrhage, mass lesion, midline shift or hydrocephalus. Vascular: Major arterial flow voids are maintained at the skull base. Skull and upper cervical spine: Normal marrow signal. Retroflexed dens. Sinuses/Orbits: Negative. Other: No mastoid effusions. IMPRESSION: Multiple small acute right frontoparietal, perirolandic infarcts. Electronically Signed   By: Feliberto Harts M.D.   On: 04/14/2023 23:23   CT HEAD WO CONTRAST Result Date: 04/14/2023 CLINICAL DATA:  Numbness or tingling, paresthesia. Left hand weakness. EXAM: CT HEAD WITHOUT CONTRAST TECHNIQUE: Contiguous axial images were obtained from the base of the skull through the vertex without intravenous contrast. RADIATION DOSE REDUCTION: This exam was performed according to the departmental dose-optimization program which includes automated exposure control, adjustment of the mA and/or kV according to patient size and/or use of iterative reconstruction technique. COMPARISON:  None Available. FINDINGS: Brain: There is no evidence of an acute infarct, intracranial hemorrhage, mass, midline shift, or extra-axial fluid collection. Cerebral volume is within normal limits for age. The ventricles are normal in size. Vascular: Calcified  atherosclerosis at the skull base. No hyperdense vessel. Skull: No acute fracture or suspicious lesion. Sinuses/Orbits: Visualized paranasal sinuses and mastoid air cells are clear. Unremarkable included orbits. Other: None. IMPRESSION: No evidence of acute intracranial abnormality. Electronically Signed   By: Sebastian Ache M.D.   On: 04/14/2023 16:52    Microbiology: Results for orders placed or performed during the hospital encounter of 03/11/23  Group A Strep by PCR ([facility] Only)     Status: None   Collection Time: 03/11/23 11:31 AM   Specimen: Throat; Sterile Swab  Result Value Ref Range Status   Group A Strep by PCR NOT DETECTED NOT DETECTED Final    Comment: Performed at [facility] [address] [place], [place] [address]  Resp panel by RT-PCR (RSV, Flu A&B, Covid) Throat     Status: None   Collection Time: 03/11/23 11:31 AM   Specimen: Throat; Nasal Swab  Result Value Ref Range Status   SARS Coronavirus 2 by RT PCR NEGATIVE NEGATIVE Final    Comment: (NOTE) SARS-CoV-2 target nucleic acids are NOT DETECTED.  The SARS-CoV-2 RNA is generally detectable in upper respiratory specimens during the acute phase of infection. The lowest concentration of SARS-CoV-2 viral copies this assay can detect is 138 copies/mL. A negative result does not preclude SARS-Cov-2 infection and should not be used as the sole basis for treatment or other patient management decisions. A negative result may occur with  improper specimen collection/handling, submission of specimen other  than nasopharyngeal swab, presence of viral mutation(s) within the areas targeted by this assay, and inadequate number of viral copies(<138 copies/mL). A negative result must be combined with clinical observations, patient history, and epidemiological information. The expected result is Negative.  Fact Sheet for Patients:  BloggerCourse.com  Fact Sheet for Healthcare Providers:   SeriousBroker.it  This test is no t yet approved or cleared by the [place] FDA and  has been authorized for detection and/or diagnosis of SARS-CoV-2 by FDA under an Emergency Use Authorization (EUA). This EUA will remain  in effect (meaning this test can be used) for the duration of the COVID-19 declaration under Section 564(b)(1) of the Act, 21 U.S.C.section 360bbb-3(b)(1), unless the authorization is terminated  or revoked sooner.       Influenza A by PCR NEGATIVE NEGATIVE Final   Influenza B by PCR NEGATIVE NEGATIVE Final    Comment: (NOTE) The Xpert Xpress SARS-CoV-2/FLU/RSV plus assay is intended as an aid in the diagnosis of influenza from Nasopharyngeal swab specimens and should not be used as a sole basis for treatment. Nasal washings and aspirates are unacceptable for Xpert Xpress SARS-CoV-2/FLU/RSV testing.  Fact Sheet for Patients: BloggerCourse.com  Fact Sheet for Healthcare Providers: SeriousBroker.it  This test is not yet approved or cleared by the [place] FDA and has been authorized for detection and/or diagnosis of SARS-CoV-2 by FDA under an Emergency Use Authorization (EUA). This EUA will remain in effect (meaning this test can be used) for the duration of the COVID-19 declaration under Section 564(b)(1) of the Act, 21 U.S.C. section 360bbb-3(b)(1), unless the authorization is terminated or revoked.     Resp Syncytial Virus by PCR NEGATIVE NEGATIVE Final    Comment: (NOTE) Fact Sheet for Patients: BloggerCourse.com  Fact Sheet for Healthcare Providers: SeriousBroker.it  This test is not yet approved or cleared by the [place] FDA and has been authorized for detection and/or diagnosis of SARS-CoV-2 by FDA under an Emergency Use Authorization (EUA). This EUA will remain in effect (meaning this test can be used) for  the duration of the COVID-19 declaration under Section 564(b)(1) of the Act, 21 U.S.C. section 360bbb-3(b)(1), unless the authorization is terminated or revoked.  Performed at [facility], [address] [place], [place] [address]   Blood culture (routine x 2)     Status: None   Collection Time: 03/11/23  1:56 PM   Specimen: BLOOD  Result Value Ref Range Status   Specimen Description BLOOD BLOOD LEFT WRIST  Final   Special Requests   Final    BOTTLES DRAWN AEROBIC ONLY Blood Culture results may not be optimal due to an inadequate volume of blood received in culture bottles   Culture   Final    NO GROWTH 5 DAYS Performed at [facility], [address] [place], [place] [address]    Report Status 03/16/2023 FINAL  Final  Blood culture (routine x 2)     Status: None   Collection Time: 03/11/23  2:10 PM   Specimen: BLOOD  Result Value Ref Range Status   Specimen Description BLOOD BLOOD RIGHT FOREARM  Final   Special Requests   Final    BOTTLES DRAWN AEROBIC ONLY Blood Culture results may not be optimal due to an inadequate volume of blood received in culture bottles   Culture   Final    NO GROWTH 5 DAYS Performed at [facility], [address], [place], [place] [address]    Report Status 03/16/2023 FINAL  Final  Respiratory (~20 pathogens) panel by PCR     Status: None   Collection Time: 03/11/23  5:17 PM   Specimen: Nasopharyngeal Swab; Respiratory  Result Value Ref Range Status   Adenovirus NOT DETECTED NOT DETECTED Final   Coronavirus 229E NOT DETECTED NOT DETECTED Final    Comment: (NOTE) The Coronavirus on the Respiratory Panel, DOES NOT test for the novel  Coronavirus (2019 nCoV)    Coronavirus HKU1 NOT DETECTED NOT DETECTED Final   Coronavirus NL63 NOT DETECTED NOT DETECTED Final   Coronavirus OC43 NOT DETECTED NOT DETECTED Final   Metapneumovirus NOT DETECTED NOT DETECTED Final   Rhinovirus / Enterovirus NOT DETECTED NOT DETECTED  Final   Influenza A NOT DETECTED NOT DETECTED Final   Influenza B NOT DETECTED NOT DETECTED Final   Parainfluenza Virus 1 NOT DETECTED NOT DETECTED Final   Parainfluenza Virus 2 NOT DETECTED NOT DETECTED Final   Parainfluenza Virus 3 NOT DETECTED NOT DETECTED Final   Parainfluenza Virus 4 NOT DETECTED NOT DETECTED Final   Respiratory Syncytial Virus NOT DETECTED NOT DETECTED Final   Bordetella pertussis NOT DETECTED NOT DETECTED Final   Bordetella Parapertussis NOT DETECTED NOT DETECTED Final   Chlamydophila pneumoniae NOT DETECTED NOT DETECTED Final   Mycoplasma pneumoniae NOT DETECTED NOT DETECTED Final    Comment: Performed at [facility], [address] [place] [address], [place], [place] [address]  MRSA Next Gen by PCR, Nasal     Status: None   Collection Time: 03/11/23  6:29 PM   Specimen: Nasal Mucosa; Nasal Swab  Result Value Ref Range Status   MRSA by PCR Next Gen NOT DETECTED NOT DETECTED Final    Comment: (NOTE) The GeneXpert MRSA Assay (FDA approved for NASAL specimens only), is one component of a comprehensive MRSA colonization surveillance program. It is not intended to diagnose MRSA infection nor to guide or monitor treatment for MRSA infections. Test performance is not FDA approved in patients less than 75 years old. Performed at [facility], [address] [place], [place] [address]     Labs: CBC: Recent Labs  Lab 04/14/23 1555  WBC 9.4  NEUTROABS 6.3  HGB 13.3  HCT 39.6  MCV 80.3  PLT 301   Basic Metabolic Panel: Recent Labs  Lab 04/14/23 1555  NA 133*  K 3.3*  CL 101  CO2 20*  GLUCOSE 96  BUN 11  CREATININE 0.99  CALCIUM 9.0  MG 1.5*  PHOS 3.1   Liver Function Tests: Recent Labs  Lab 04/14/23 1555  AST 17  ALT 13  ALKPHOS 54  BILITOT 1.0  PROT 6.8  ALBUMIN 3.6   CBG: Recent Labs  Lab 04/14/23 1809  GLUCAP 89    Discharge time spent: greater than 30 minutes.  Signed: Harold Hedge, MD [facility] 04/15/2023

## 2023-04-15 NOTE — Evaluation (Signed)
 Physical Therapy Evaluation Patient Details Name: William Osborne MRN: [id number] DOB: [birth year] Today's Date: 04/15/2023  History of Present Illness  68 y/o male presented to ED on 04/14/23 for headache and L arm weakness and numbness. MRI found multiple small acute R frontoparietal infarcts. PMH: HTN, tobacco abuse  Clinical Impression  Patient admitted with the above. PTA, patient lives with wife and reports independence with mobility. Patient continues to function at independent level for mobility in hallway. Denies any sensation or motor changes of BLEs. Continues to complain of L UE weakness (specifically grip strength). No further skilled PT needs identified acutely. No PT follow up recommended at this time. PT will sign off.         Equipment Recommendations None recommended by PT  Recommendations for Other Services       Functional Status Assessment Patient has not had a recent decline in their functional status     Precautions / Restrictions Precautions Precautions: None Recall of Precautions/Restrictions: Intact Restrictions Weight Bearing Restrictions Per Provider Order: No      Mobility  Bed Mobility Overal bed mobility: Independent                  Transfers Overall transfer level: Independent Equipment used: None                    Ambulation/Gait Ambulation/Gait assistance: Independent Gait Distance (Feet): 200 Feet Assistive device: None Gait Pattern/deviations: WFL(Within Functional Limits)   Gait velocity interpretation: >4.37 ft/sec, indicative of normal walking speed      Stairs            Wheelchair Mobility     Tilt Bed    Modified Rankin (Stroke Patients Only)       Balance Overall balance assessment: No apparent balance deficits (not formally assessed)                                           Pertinent Vitals/Pain Pain Assessment Pain Assessment: No/denies pain    Home Living  Family/patient expects to be discharged to:: Private residence Living Arrangements: Spouse/significant other Available Help at Discharge: Family;Available 24 hours/day Type of Home: House Home Access: Stairs to enter Entrance Stairs-Rails: Doctor, general practice of Steps: 4   Home Layout: One level Home Equipment: None      Prior Function Prior Level of Function : Independent/Modified Independent;Driving             Mobility Comments: no AD       Extremity/Trunk Assessment   Upper Extremity Assessment Upper Extremity Assessment: Defer to OT evaluation    Lower Extremity Assessment Lower Extremity Assessment: Overall WFL for tasks assessed       Communication   Communication Communication: No apparent difficulties    Cognition Arousal: Alert Behavior During Therapy: WFL for tasks assessed/performed   PT - Cognitive impairments: No apparent impairments                         Following commands: Intact       Cueing       General Comments      Exercises     Assessment/Plan    PT Assessment Patient does not need any further PT services  PT Problem List         PT Treatment Interventions  PT Goals (Current goals can be found in the Care Plan section)  Acute Rehab PT Goals Patient Stated Goal: to go home PT Goal Formulation: All assessment and education complete, DC therapy    Frequency       Co-evaluation               AM-PAC PT "6 Clicks" Mobility  Outcome Measure Help needed turning from your back to your side while in a flat bed without using bedrails?: None Help needed moving from lying on your back to sitting on the side of a flat bed without using bedrails?: None Help needed moving to and from a bed to a chair (including a wheelchair)?: None Help needed standing up from a chair using your arms (e.g., wheelchair or bedside chair)?: None Help needed to walk in hospital room?: None Help needed climbing  3-5 steps with a railing? : None 6 Click Score: 24    End of Session   Activity Tolerance: Patient tolerated treatment well Patient left: in bed;with call bell/phone within reach;with family/visitor present Nurse Communication: Mobility status PT Visit Diagnosis: Muscle weakness (generalized) (M62.81)    Time: 5284-1324 PT Time Calculation (min) (ACUTE ONLY): 11 min   Charges:   PT Evaluation $PT Eval Low Complexity: 1 Low   PT General Charges $$ ACUTE PT VISIT: 1 Visit         Maylon Peppers, PT, DPT Physical Therapist - [facility]  [facility]   Miko Markwood A Jalani Rominger 04/15/2023, 9:23 AM

## 2023-04-15 NOTE — Progress Notes (Signed)
*  PRELIMINARY RESULTS* Echocardiogram 2D Echocardiogram has been performed.  Cristela Blue 04/15/2023, 2:49 PM

## 2023-04-15 NOTE — ED Notes (Signed)
 CCMD called for cardiac monitoring.

## 2023-05-16 ENCOUNTER — Other Ambulatory Visit: Payer: Self-pay

## 2023-05-16 DIAGNOSIS — R7989 Other specified abnormal findings of blood chemistry: Secondary | ICD-10-CM

## 2023-05-16 DIAGNOSIS — R600 Localized edema: Secondary | ICD-10-CM

## 2023-05-17 ENCOUNTER — Ambulatory Visit
Admission: RE | Admit: 2023-05-17 | Discharge: 2023-05-17 | Disposition: A | Discharge status: Home / Self Care | Source: Ambulatory Visit

## 2023-05-17 DIAGNOSIS — R600 Localized edema: Secondary | ICD-10-CM | POA: Insufficient documentation

## 2023-05-17 DIAGNOSIS — R7989 Other specified abnormal findings of blood chemistry: Secondary | ICD-10-CM | POA: Diagnosis present

## 2023-05-19 DIAGNOSIS — R7989 Other specified abnormal findings of blood chemistry: Secondary | ICD-10-CM

## 2023-05-19 DIAGNOSIS — R600 Localized edema: Secondary | ICD-10-CM
